# Patient Record
Sex: Male | Born: 1935 | Race: White | Hispanic: No | Smoking: Former smoker
Health system: Southern US, Community
[De-identification: ages and names within clinical notes are randomized; demographics above are authoritative.]

## PROBLEM LIST (undated history)

## (undated) DIAGNOSIS — I2510 Atherosclerotic heart disease of native coronary artery without angina pectoris: Secondary | ICD-10-CM

## (undated) DIAGNOSIS — R0602 Shortness of breath: Secondary | ICD-10-CM

## (undated) DIAGNOSIS — I10 Essential (primary) hypertension: Secondary | ICD-10-CM

## (undated) DIAGNOSIS — K4030 Unilateral inguinal hernia, with obstruction, without gangrene, not specified as recurrent: Secondary | ICD-10-CM

## (undated) DIAGNOSIS — R059 Cough, unspecified: Secondary | ICD-10-CM

## (undated) DIAGNOSIS — I452 Bifascicular block: Secondary | ICD-10-CM

## (undated) DIAGNOSIS — K259 Gastric ulcer, unspecified as acute or chronic, without hemorrhage or perforation: Secondary | ICD-10-CM

## (undated) DIAGNOSIS — K2990 Gastroduodenitis, unspecified, without bleeding: Secondary | ICD-10-CM

## (undated) DIAGNOSIS — F17200 Nicotine dependence, unspecified, uncomplicated: Secondary | ICD-10-CM

## (undated) DIAGNOSIS — R05 Cough: Secondary | ICD-10-CM

## (undated) DIAGNOSIS — D649 Anemia, unspecified: Secondary | ICD-10-CM

## (undated) DIAGNOSIS — I209 Angina pectoris, unspecified: Secondary | ICD-10-CM

## (undated) DIAGNOSIS — R609 Edema, unspecified: Secondary | ICD-10-CM

## (undated) DIAGNOSIS — Z720 Tobacco use: Secondary | ICD-10-CM

## (undated) DIAGNOSIS — J449 Chronic obstructive pulmonary disease, unspecified: Secondary | ICD-10-CM

## (undated) DIAGNOSIS — R011 Cardiac murmur, unspecified: Secondary | ICD-10-CM

## (undated) HISTORY — DX: Tobacco use: Z72.0

## (undated) HISTORY — PX: COLONOSCOPY: SHX174

## (undated) HISTORY — PX: CORONARY ANGIOPLASTY: SHX604

## (undated) HISTORY — DX: Essential (primary) hypertension: I10

## (undated) HISTORY — DX: Edema, unspecified: R60.9

## (undated) HISTORY — PX: CARDIAC CATHETERIZATION: SHX172

---

## 2009-07-22 ENCOUNTER — Emergency Department (HOSPITAL_COMMUNITY): Admission: EM | Admit: 2009-07-22 | Discharge: 2009-07-22 | Payer: Self-pay | Admitting: Emergency Medicine

## 2009-11-03 ENCOUNTER — Ambulatory Visit: Payer: Self-pay | Admitting: Cardiology

## 2009-11-03 DIAGNOSIS — R0602 Shortness of breath: Secondary | ICD-10-CM

## 2009-11-03 DIAGNOSIS — J4489 Other specified chronic obstructive pulmonary disease: Secondary | ICD-10-CM

## 2009-11-03 DIAGNOSIS — J449 Chronic obstructive pulmonary disease, unspecified: Secondary | ICD-10-CM

## 2009-11-03 DIAGNOSIS — I2510 Atherosclerotic heart disease of native coronary artery without angina pectoris: Secondary | ICD-10-CM | POA: Insufficient documentation

## 2009-11-03 DIAGNOSIS — F17200 Nicotine dependence, unspecified, uncomplicated: Secondary | ICD-10-CM | POA: Insufficient documentation

## 2009-11-03 HISTORY — DX: Atherosclerotic heart disease of native coronary artery without angina pectoris: I25.10

## 2009-11-03 HISTORY — DX: Nicotine dependence, unspecified, uncomplicated: F17.200

## 2009-11-03 HISTORY — DX: Other specified chronic obstructive pulmonary disease: J44.89

## 2009-11-03 HISTORY — DX: Chronic obstructive pulmonary disease, unspecified: J44.9

## 2009-11-04 ENCOUNTER — Encounter: Payer: Self-pay | Admitting: Cardiology

## 2009-11-11 ENCOUNTER — Ambulatory Visit: Payer: Self-pay | Admitting: Cardiology

## 2009-11-11 ENCOUNTER — Encounter (HOSPITAL_COMMUNITY): Admission: RE | Admit: 2009-11-11 | Discharge: 2009-11-11 | Payer: Self-pay | Admitting: Oncology

## 2009-11-20 ENCOUNTER — Encounter: Payer: Self-pay | Admitting: Cardiology

## 2009-11-21 ENCOUNTER — Encounter (INDEPENDENT_AMBULATORY_CARE_PROVIDER_SITE_OTHER): Payer: Self-pay

## 2010-02-11 ENCOUNTER — Encounter: Payer: Self-pay | Admitting: Cardiovascular Disease

## 2010-04-14 NOTE — Letter (Signed)
Summary: PROGRESS NOTES DR Sunday Spillers 2007  PROGRESS NOTES DR Sunday Spillers 2007   Imported By: Raechel Ache [facility] 11/04/2009 09:36:56  _____________________________________________________________________  External Attachment:    Type:   Image     Comment:   External Document

## 2010-04-14 NOTE — Miscellaneous (Signed)
Summary: Orders Update: Pulmonary referral  Clinical Lists Changes  Orders: Added new Referral order of Pulmonary Referral (Pulmonary) - Signed    Pt. advised of PFT results and Pulmonary referral. Pt. states he is going out of town for several weeks (leaving tomorrow) and would like to wait until returning home to scheduled appt. Pt. requested we schedule for the third week in October. Pt. advised that someone from this office will contact him when he returns home. He expressed verbal understanding.

## 2010-04-14 NOTE — Letter (Signed)
Summary: LABS 2008 DR Sunday Spillers  LABS 2008 DR BARBLER   Imported By: Raechel Ache [facility] 11/04/2009 09:38:01  _____________________________________________________________________  External Attachment:    Type:   Image     Comment:   External Document

## 2010-04-14 NOTE — Letter (Signed)
Summary: DR Margo Aye OFFICE NOTE  DR HALL OFFICE NOTE   Imported By: Faythe Ghee 02/11/2010 11:23:44  _____________________________________________________________________  External Attachment:    Type:   Image     Comment:   External Document

## 2010-04-14 NOTE — Letter (Signed)
Summary: [place] [facility] (Nuc Med Stress)  [facility]  [address], [place] [address]   Phone: [phone]  Fax: [phone]    Nuclear Medicine 1-Day Stress Test Information Sheet  Re:     Roberto Nelson   DOB:     10/13/1933 MRN:     [id number] Weight:  Appointment Date: Register at: Appointment Time: Referring MD:  ___Exercise Stress  __Adenosine   __Dobutamine  _X_Lexiscan  __Persantine   __Thallium  Urgency: ____1 (next day)   ____2 (one week)    ____3 (PRN)  Patient will receive Follow Up call with results: Patient needs follow-up appointment:  Instructions regarding medication:  How to prepare for your stress test: 1. DO NOT eat or dring 6 hours prior to your arrival time. This includes no caffeine (coffee, tea, sodas, chocolate) if you were instructed to take your medications, drink water with it. 2. DO NOT use any tobacco products for at leaset 8 hours prior to arrival. 3. DO NOT wear dresses or any clothing that may have metal clasps or buttons. 4. Wear short sleeve shirts, loose clothing, and comfortalbe walking shoes. 5. DO NOT use lotions, oils or powder on your chest before the test. 6. The test will take approximately 3-4 hours from the time you arrive until completion. 7. To register the day of the test, go to the Short Stay entrance at [facility]. 8. If you must cancel your test, call [phone] as soon as you are aware.  After you arrive for test:   When you arrive at [facility], you will go to Short Stay to be registered. They will then send you to Radiology to check in. The Nuclear Medicine Tech will get you and start an IV in your arm or hand. A small amount of a radioactive tracer will then be injected into your IV. This tracer will then have to circulate for 30-45 minutes. During this time you will wait in the waiting room and you will be able to drink something without caffeine. A series of pictures will be taken  of your heart follwoing this waiting period. After the 1st set of pictures you will go to the stress lab to get ready for your stress test. During the stress test, another small amount of a radioactive tracer will be injected through your IV. When the stress test is complete, there is a short rest period while your heart rate and blood pressure will be monitored. When this monitoring period is complete you will have another set of pictrues taken. (The same as the 1st set of pictures). These pictures are taken between 15 minutes and 1 hour after the stress test. The time depends on the type of stress test you had. Your doctor will inform you of your test results within 7 days after test.    The possibilities of certain changes are possible during the test. They include abnormal blood pressure and disorders of the heart. Side effects of persantine or adenosine can include flushing, chest pain, shortness of breath, stomach tightness, headache and light-headedness. These side effects usually do not last long and are self-resolving. Every effort will be made to keep you comfortable and to minimize complications by obtaining a medical history and by close observation during the test. Emergency equipment, medications, and trained personnel are available to deal with any unusual situation which may arise.  Please notify office at least 48 hours in advance if you are unable to keep  this appt.

## 2010-04-14 NOTE — Letter (Signed)
Summary: CT ABDOMEN 05-30-2006  CT ABDOMEN 05-30-2006   Imported By: Raechel Ache [facility] 11/04/2009 09:35:23  _____________________________________________________________________  External Attachment:    Type:   Image     Comment:   External Document

## 2010-04-14 NOTE — Assessment & Plan Note (Signed)
Summary: ***np6/new to area/Hernia Surgery/eval for stress test/   Visit Type:  Initial Consult Primary Roberto Nelson:  Roberto Nelson  CC:  no cardiology complaints.  History of Present Illness: Roberto Nelson is a gregarious pleasant 77 year old gentleman who comes in today for reoperative clearance for a large right inguinal hernia. He is referred by Dr. Abbey Chatters.  Unfortunately, we do not have his cardiac records. We do have a primary care physician's note from [place] dated May 25, 2006 and September 06, 2006.  He has a history of coronary disease and had a stent placed years ago. He does not know the stent type and is not caring identification card. He does remember that prior to his stent that he had significant tightness in his chest. We do not know if he infarcted.  He has significant dyspnea on exertion. He tries to walk about a mile a day on flat ground. He continues to smoke heavily. He has a chronic cough with secretions.  He denies orthopnea, PND or edema. He does not carry sublingual nitroglycerin.  His EKG from the outside shows normal sinus rhythm, left anterior fascicular block, and a right bundle branch block.  He seems to be compliant with his medications. He is followed here by Dr. Margo Aye.  Current Medications (verified): 1)  Plavix 75 Mg Tabs (Clopidogrel Bisulfate) .... Take 1 Tab Daily 2)  Lisinopril 10 Mg Tabs (Lisinopril) .... Take 1 Tab Daily 3)  Nitrostat 0.4 Mg Subl (Nitroglycerin) .... Use As Needed 4)  Toprol Xl 50 Mg Xr24h-Tab (Metoprolol Succinate) .... Take 1 Tab Daily  Allergies (verified): 1)  ! Penicillin  Review of Systems       negative other than history of present illness  Vital Signs:  Patient profile:   77 year old male Height:      67 inches Weight:      181 pounds BMI:     28.45 Pulse rate:   67 / minute BP sitting:   176 / 91  (right arm)  Vitals Entered By: Dreama Saa, CNA (November 03, 2009 3:13 PM)  Physical Exam  General:   no acute distress, looks older than stated age, coughing quite a bit the office. Head:  normocephalic and atraumatic Eyes:  PERRLA/EOM intact; conjunctiva and lids normal. Neck:  Neck supple, no JVD. No masses, thyromegaly or abnormal cervical nodes. Chest Wall:  no deformities or breast masses noted Lungs:  decreased breath sounds throughout with expiratory rhonchi that clears with coughing. Heart:  PMI poorly appreciated, soft S1-S2, no murmur rub or gallop. Regular rate and rhythm Abdomen:  normal except for a large right inguinal hernia. Msk:  Back normal, normal gait. Muscle strength and tone normal. Pulses:  pulses normal in all 4 extremities Extremities:  No clubbing or cyanosis. Neurologic:  Alert and oriented x 3. Skin:  Intact without lesions or rashes. Psych:  Normal affect.   EKG  Procedure date:  11/03/2009  Findings:      normal sinus rhythm, right bundle branch block, left anterior fascicular block, no acute changes.  Impression & Recommendations:  Problem # 1:  CORONARY ATHEROSCLEROSIS NATIVE CORONARY ARTERY (ICD-414.01) Unfortunately We have no records. His EKG is stable. His dyspnea is probably from significant COPD but could be an ischemic equivalent. He clearly needs a functional study prior to clearance. We'll arrange a Myoview. If there is any question about ejection fraction, we will obtain an echocardiogram. His updated medication list for this problem includes:    Plavix 75  Mg Tabs (Clopidogrel bisulfate) .Marland Kitchen... Take 1 tab daily    Lisinopril 10 Mg Tabs (Lisinopril) .Marland Kitchen... Take 1 tab daily    Nitrostat 0.4 Mg Subl (Nitroglycerin) ..... Use as needed    Toprol Xl 50 Mg Xr24h-tab (Metoprolol succinate) .Marland Kitchen... Take 1 tab daily  Orders: Nuclear Stress Test (Nuc Stress Test)  Problem # 2:  DYSPNEA (ICD-786.05)  His updated medication list for this problem includes:    Lisinopril 10 Mg Tabs (Lisinopril) .Marland Kitchen... Take 1 tab daily    Toprol Xl 50 Mg Xr24h-tab  (Metoprolol succinate) .Marland Kitchen... Take 1 tab daily  Orders: Nuclear Stress Test (Nuc Stress Test)  Problem # 3:  COPD (ICD-496) I have recommended pulmonary function studies with a room air blood gas. If these look marginal for surgery, I'll refer to pulmonary medicine Orders: Pulmonary Function Test (PFT)  Problem # 4:  TOBACCO ABUSE (ICD-305.1) Advised to quit.  Patient Instructions: 1)  Your physician recommends that you schedule a follow-up appointment in: TBA 2)  Your physician recommends that you continue on your current medications as directed. Please refer to the Current Medication list given to you today. 3)  Your physician has requested that you have an [employer]  For further information please visit [email or web address]  Please follow instruction sheet, as given. 4)  Your physician has recommended that you have a pulmonary function test.  Pulmonary Function Tests are a group of tests that measure how well air moves in and out of your lungs.

## 2010-05-29 LAB — BLOOD GAS, ARTERIAL
Bicarbonate: 28.1 mEq/L — ABNORMAL HIGH (ref 20.0–24.0)
FIO2: 0.21 %
pH, Arterial: 7.414 (ref 7.350–7.450)
pO2, Arterial: 69.9 mmHg — ABNORMAL LOW (ref 80.0–100.0)

## 2010-06-02 LAB — URINALYSIS, ROUTINE W REFLEX MICROSCOPIC
Glucose, UA: NEGATIVE mg/dL
Nitrite: NEGATIVE
Protein, ur: NEGATIVE mg/dL
Specific Gravity, Urine: 1.015 (ref 1.005–1.030)
pH: 6.5 (ref 5.0–8.0)

## 2011-03-15 ENCOUNTER — Encounter: Payer: Self-pay | Admitting: Cardiology

## 2011-11-01 ENCOUNTER — Encounter (INDEPENDENT_AMBULATORY_CARE_PROVIDER_SITE_OTHER): Payer: Self-pay | Admitting: General Surgery

## 2011-11-01 ENCOUNTER — Ambulatory Visit (INDEPENDENT_AMBULATORY_CARE_PROVIDER_SITE_OTHER): Payer: Medicare Other | Admitting: General Surgery

## 2011-11-01 ENCOUNTER — Encounter (INDEPENDENT_AMBULATORY_CARE_PROVIDER_SITE_OTHER): Payer: Self-pay

## 2011-11-01 VITALS — BP 180/98 | HR 80 | Temp 98.3°F | Ht 67.0 in | Wt 169.0 lb

## 2011-11-01 DIAGNOSIS — K4090 Unilateral inguinal hernia, without obstruction or gangrene, not specified as recurrent: Secondary | ICD-10-CM | POA: Insufficient documentation

## 2011-11-01 NOTE — Patient Instructions (Signed)
We will arrange for you to see a Cardiologist to make sure it is safe to do your surgery from a heart standpoint.

## 2011-11-01 NOTE — Progress Notes (Signed)
Patient ID: Roberto Nelson, male   DOB: [birth year], 78 y.o.   MRN: [id number]  Chief Complaint  Patient presents with  . Pre-op Exam    eval RIH    HPI Roberto Nelson is a 78 y.o. male.   HPI  He is well known to me. I saw him 2 years ago and we discussed repair of a large right inguinal hernia. He had multiple things occurring in his life at that time and so did not schedule the surgery. He is sent over by Dr. Margo Aye to discuss hernia repair again.  No obstructive symptoms.  Past Medical History  Diagnosis Date  . Hypertension   . Edema   . Atherosclerosis   . Tobacco user     Past Surgical History  Procedure Date  . Colonoscopy   . Hernia repair   . Cardiac catheterization     History reviewed. No pertinent family history.  Social History History  Substance Use Topics  . Smoking status: Current Everyday Smoker -- 0.5 packs/day    Types: Cigarettes  . Smokeless tobacco: Not on file  . Alcohol Use: No    Allergies  Allergen Reactions  . Penicillins     Current Outpatient Prescriptions  Medication Sig Dispense Refill  . clopidogrel (PLAVIX) 75 MG tablet Take 75 mg by mouth daily.        Marland Kitchen lisinopril (PRINIVIL,ZESTRIL) 10 MG tablet Take 10 mg by mouth daily.        . metoprolol (TOPROL-XL) 50 MG 24 hr tablet Take 50 mg by mouth daily.        . nitroGLYCERIN (NITROSTAT) 0.4 MG SL tablet Place 0.4 mg under the tongue as needed.          Review of Systems Review of Systems  Respiratory: Negative.   Cardiovascular: Negative.   Gastrointestinal: Negative for nausea, vomiting, abdominal pain and constipation.  Genitourinary: Negative for difficulty urinating.    Blood pressure 180/98, pulse 80, temperature 98.3 F (36.8 C), temperature source Temporal, height 5\' 7"  (1.702 m), weight 169 lb (76.658 kg), SpO2 93.00%.  Physical Exam Physical Exam  Constitutional: He appears well-developed and well-nourished. No distress.  Cardiovascular: Normal rate.    Intermittently irregular rhythm.  Pulmonary/Chest: Effort normal and breath sounds normal.  Abdominal: Soft. He exhibits no mass. There is no tenderness.  Genitourinary:       Large right inguinal hernia extending into scrotum that is not reducible.  Cannot palpate right testicle.  Musculoskeletal: Normal range of motion. He exhibits no edema.    Data Reviewed Old note.  Assessment    Large chronically incarcerated right inguinal hernia extending into scrotum.  Needs cardiac clearance prior to surgery.    Plan    Consult to Cardiology and if risk is not prohibitive will proceed with open repair of hernia with mesh and an overnight stay.  Will need to be off Plavix for seven days.  I have explained the procedure, risks, and aftercare of inguinal hernia repair.  Risks include but are not limited to bleeding, infection, wound problems, anesthesia, recurrence, bladder or intestine injury, urinary retention, testicular dysfunction, chronic pain, mesh problems.  He seems to understand and agrees with the plan.       Temple Ewart J 11/01/2011, 10:52 AM

## 2011-11-24 ENCOUNTER — Encounter (INDEPENDENT_AMBULATORY_CARE_PROVIDER_SITE_OTHER): Payer: Self-pay

## 2011-11-26 ENCOUNTER — Encounter (INDEPENDENT_AMBULATORY_CARE_PROVIDER_SITE_OTHER): Payer: Self-pay

## 2011-12-01 ENCOUNTER — Telehealth (INDEPENDENT_AMBULATORY_CARE_PROVIDER_SITE_OTHER): Payer: Self-pay

## 2011-12-01 NOTE — Telephone Encounter (Signed)
Pt notified of appt with Dr. Kathrynn Speed on 12/30/11 at 10:00am in the [place] office.

## 2011-12-09 ENCOUNTER — Telehealth (INDEPENDENT_AMBULATORY_CARE_PROVIDER_SITE_OTHER): Payer: Self-pay

## 2011-12-09 NOTE — Telephone Encounter (Signed)
Dr Dwana Melena called stating pt will need our office to refer pt to cardiologist for clearance and ok to d/c plavix 5 days pre op.

## 2011-12-30 ENCOUNTER — Encounter: Payer: Self-pay | Admitting: Cardiology

## 2011-12-30 ENCOUNTER — Ambulatory Visit (INDEPENDENT_AMBULATORY_CARE_PROVIDER_SITE_OTHER): Payer: Self-pay | Admitting: Cardiology

## 2011-12-30 VITALS — BP 190/100 | HR 56 | Ht 67.0 in | Wt 174.4 lb

## 2011-12-30 DIAGNOSIS — R0602 Shortness of breath: Secondary | ICD-10-CM

## 2011-12-30 DIAGNOSIS — J4489 Other specified chronic obstructive pulmonary disease: Secondary | ICD-10-CM

## 2011-12-30 DIAGNOSIS — R600 Localized edema: Secondary | ICD-10-CM | POA: Insufficient documentation

## 2011-12-30 DIAGNOSIS — I2510 Atherosclerotic heart disease of native coronary artery without angina pectoris: Secondary | ICD-10-CM

## 2011-12-30 DIAGNOSIS — J449 Chronic obstructive pulmonary disease, unspecified: Secondary | ICD-10-CM

## 2011-12-30 DIAGNOSIS — F17200 Nicotine dependence, unspecified, uncomplicated: Secondary | ICD-10-CM

## 2011-12-30 DIAGNOSIS — R609 Edema, unspecified: Secondary | ICD-10-CM

## 2011-12-30 DIAGNOSIS — I452 Bifascicular block: Secondary | ICD-10-CM

## 2011-12-30 HISTORY — DX: Bifascicular block: I45.2

## 2011-12-30 LAB — BASIC METABOLIC PANEL
Calcium: 9.4 mg/dL (ref 8.4–10.5)
Glucose, Bld: 81 mg/dL (ref 70–99)

## 2011-12-30 MED ORDER — LISINOPRIL 40 MG PO TABS: 40.0000 mg | ORAL_TABLET | Freq: Every day | ORAL | Status: DC

## 2011-12-30 MED ORDER — NITROGLYCERIN 0.4 MG SL SUBL: 0.4000 mg | SUBLINGUAL_TABLET | SUBLINGUAL | Status: DC | PRN

## 2011-12-30 MED ORDER — FUROSEMIDE 40 MG PO TABS: 40.0000 mg | ORAL_TABLET | Freq: Every day | ORAL | Status: DC

## 2011-12-30 NOTE — Progress Notes (Signed)
HPI [name] is a pleasant 78 year old white male referred by Dr. Abbey Chatters for surgical clearance for a large right inguinal hernia.  He has a history of coronary artery disease and has had a previous stent placed in [place] and 2005. He remains on Plavix after all these years. He is not taking aspirin.  He of has occasional chest tightness with ambulation. Mostly as dyspnea on exertion which is been stable. He denies orthopnea or PND. He continues to smoke about 5 cigarettes a day. He has had significant swelling in his legs.  His nitroglycerin has expired. He is compliant with his medications otherwise. He is followed by Dr. Margo Aye of primary care.  He denies any bleeding but has had a lot of bruising of his upper extremities. He would like to come off of Plavix.  He has a history of hypertension. He does not check his on regular basis.  Past Medical History  Diagnosis Date  . Hypertension   . Edema   . Atherosclerosis   . Tobacco user     Current Outpatient Prescriptions  Medication Sig Dispense Refill  . clopidogrel (PLAVIX) 75 MG tablet Take 75 mg by mouth daily.        Marland Kitchen lisinopril (PRINIVIL,ZESTRIL) 20 MG tablet Take 20 mg by mouth daily.      . metoprolol (TOPROL-XL) 50 MG 24 hr tablet Take 50 mg by mouth daily.        . nitroGLYCERIN (NITROSTAT) 0.4 MG SL tablet Place 0.4 mg under the tongue as needed.          Allergies  Allergen Reactions  . Penicillins     No family history on file.  History   Social History  . Marital Status: Widowed    Spouse Name: N/A    Number of Children: 1  . Years of Education: N/A   Occupational History  . Retired    Social History Main Topics  . Smoking status: Current Every Day Smoker -- 0.5 packs/day    Types: Cigarettes  . Smokeless tobacco: Not on file  . Alcohol Use: No  . Drug Use: No  . Sexually Active: Not on file   Other Topics Concern  . Not on file   Social History Narrative   Exercises regularly1  daughter    ROS ALL NEGATIVE EXCEPT THOSE NOTED IN HPI  PE  General Appearance: well developed, well nourished in no acute distress HEENT: symmetrical face, PERRLA, good dentition  Neck: no JVD, thyromegaly, or adenopathy, trachea midline Chest: symmetric without deformity Cardiac: PMI non-displaced, RRR, soft S1-S2, no gallop or murmur Lung: clear to ausculation and percussion Vascular: all pulses full without bruits  Abdominal: nondistended, nontender, good bowel sounds, no HSM, no bruits Extremities: no cyanosis, clubbing , 3+ pedal edema bilaterally no sign of DVT, no varicosities  Skin: normal color, no rashes Neuro: alert and oriented x 3, non-focal Pysch: normal affect  EKG Sinus bradycardia, left anterior fascicular block, right bundle branch block. No old tracings to compare. BMET No results found for this basename: na, k, cl, co2, glucose, bun, creatinine, calcium, gfrnonaa, gfraa    Lipid Panel  No results found for this basename: chol, trig, hdl, cholhdl, vldl, ldlcalc    CBC No results found for this basename: wbc, rbc, hgb, hct, plt, mcv, mch, mchc, rdw, neutrabs, lymphsabs, monoabs, eosabs, basosabs

## 2011-12-30 NOTE — Assessment & Plan Note (Signed)
He has significant lower extremity edema. This could be from left ventricular or right ventricular dysfunction. He probably has significant COPD and continues to smoke. Since I do not have any records and this is a significant problem, not to mention that his blood pressure is extremely high, we'll not clear for surgery until further evaluation. This will include a 2-D echocardiogram, increasing his lisinopril to 40 mg a day, adding Lasix 40 mg every morning, salt restriction, stop smoking, potassium rich diet, with followup in the office next week for blood pressure check and metabolic profile. We'll also check a baseline metabolic profile today.  If his blood pressures under good control, and his echocardiogram shows relatively good LV function, will clear for surgery. I discontinued his Plavix and changed him to aspirin 81 mg a day. We have refilled his subungual nitroglycerin.

## 2011-12-30 NOTE — Patient Instructions (Addendum)
Your physician recommends that you schedule a follow-up appointment in: 1 week follow up for Blood pressure check and Lab work  Your physician recommends that you return for lab work in: Today and next Thursday  Your physician has recommended you make the following change in your medication:  1 - START Aspirin 81 mg daily 2 - STOP Plavix 3 - INCREASE Lisinopril to 40 mg daily 4 - START Lasix 40 mg daily  Your physician has requested that you have an echocardiogram. Echocardiography is a painless test that uses sound waves to create images of your heart. It provides your doctor with information about the size and shape of your heart and how well your heart's chambers and valves are working. This procedure takes approximately one hour. There are no restrictions for this procedure.  Your physician recommends that you weigh, daily, at the same time every day, and in the same amount of clothing. Please record your daily weights on the handout provided and bring it to your next appointment.  Potassium rich diet - See attached information  STOP Smoking - See attached information   Low Sodium Diet - See attached information

## 2011-12-31 ENCOUNTER — Other Ambulatory Visit: Payer: Self-pay | Admitting: *Deleted

## 2011-12-31 DIAGNOSIS — R600 Localized edema: Secondary | ICD-10-CM

## 2011-12-31 DIAGNOSIS — J4489 Other specified chronic obstructive pulmonary disease: Secondary | ICD-10-CM

## 2011-12-31 DIAGNOSIS — I2510 Atherosclerotic heart disease of native coronary artery without angina pectoris: Secondary | ICD-10-CM

## 2011-12-31 DIAGNOSIS — J449 Chronic obstructive pulmonary disease, unspecified: Secondary | ICD-10-CM

## 2011-12-31 DIAGNOSIS — F17200 Nicotine dependence, unspecified, uncomplicated: Secondary | ICD-10-CM

## 2011-12-31 DIAGNOSIS — R0602 Shortness of breath: Secondary | ICD-10-CM

## 2012-01-07 ENCOUNTER — Ambulatory Visit (INDEPENDENT_AMBULATORY_CARE_PROVIDER_SITE_OTHER): Payer: Medicare Other | Admitting: *Deleted

## 2012-01-07 ENCOUNTER — Ambulatory Visit (HOSPITAL_COMMUNITY)
Admission: RE | Admit: 2012-01-07 | Discharge: 2012-01-07 | Disposition: A | Discharge status: Home / Self Care | Payer: Medicare Other | Source: Ambulatory Visit | Attending: Cardiology | Admitting: Cardiology

## 2012-01-07 VITALS — BP 150/61 | HR 64 | Ht 67.0 in | Wt 172.0 lb

## 2012-01-07 DIAGNOSIS — I10 Essential (primary) hypertension: Secondary | ICD-10-CM

## 2012-01-07 DIAGNOSIS — I369 Nonrheumatic tricuspid valve disorder, unspecified: Secondary | ICD-10-CM

## 2012-01-07 DIAGNOSIS — J449 Chronic obstructive pulmonary disease, unspecified: Secondary | ICD-10-CM

## 2012-01-07 DIAGNOSIS — K4090 Unilateral inguinal hernia, without obstruction or gangrene, not specified as recurrent: Secondary | ICD-10-CM | POA: Insufficient documentation

## 2012-01-07 DIAGNOSIS — R600 Localized edema: Secondary | ICD-10-CM

## 2012-01-07 DIAGNOSIS — R0989 Other specified symptoms and signs involving the circulatory and respiratory systems: Secondary | ICD-10-CM | POA: Insufficient documentation

## 2012-01-07 DIAGNOSIS — I2510 Atherosclerotic heart disease of native coronary artery without angina pectoris: Secondary | ICD-10-CM

## 2012-01-07 DIAGNOSIS — I079 Rheumatic tricuspid valve disease, unspecified: Secondary | ICD-10-CM | POA: Insufficient documentation

## 2012-01-07 DIAGNOSIS — I4510 Unspecified right bundle-branch block: Secondary | ICD-10-CM | POA: Insufficient documentation

## 2012-01-07 DIAGNOSIS — R0602 Shortness of breath: Secondary | ICD-10-CM

## 2012-01-07 DIAGNOSIS — J4489 Other specified chronic obstructive pulmonary disease: Secondary | ICD-10-CM

## 2012-01-07 DIAGNOSIS — F17200 Nicotine dependence, unspecified, uncomplicated: Secondary | ICD-10-CM

## 2012-01-07 DIAGNOSIS — R0609 Other forms of dyspnea: Secondary | ICD-10-CM | POA: Insufficient documentation

## 2012-01-07 NOTE — Progress Notes (Signed)
*  PRELIMINARY RESULTS* Echocardiogram 2D Echocardiogram has been performed.  Roberto Nelson 01/07/2012, 9:09 AM

## 2012-01-07 NOTE — Progress Notes (Signed)
Patient presents for blood pressure check.  States he has been taking his blood pressures before taking his medications, therefore has been getting unsatisfactory readings, see scanned BP list.  Advised him to continue taking pressures, however to take at least 1 hour after mediations have been taken.  BP was 190/100 at OV and changes to medications were made, as follows:  INCREASE Lisinopril to 40 mg daily,  START Lasix 40 mg daily.

## 2012-01-19 ENCOUNTER — Ambulatory Visit (INDEPENDENT_AMBULATORY_CARE_PROVIDER_SITE_OTHER): Payer: Medicare Other | Admitting: *Deleted

## 2012-01-19 ENCOUNTER — Encounter: Payer: Self-pay | Admitting: *Deleted

## 2012-01-19 VITALS — BP 144/80 | HR 60 | Wt 166.0 lb

## 2012-01-19 DIAGNOSIS — I10 Essential (primary) hypertension: Secondary | ICD-10-CM

## 2012-01-20 ENCOUNTER — Telehealth: Payer: Self-pay | Admitting: *Deleted

## 2012-01-20 ENCOUNTER — Encounter: Payer: Self-pay | Admitting: *Deleted

## 2012-01-20 NOTE — Telephone Encounter (Signed)
I spoke with pt after Dr. Daleen Squibb reviewed recent blood pressures.  He has cleared hiim for his hernia surgery. I will also e-send clearance letter to Dr. Abbey Chatters. Mylo Red RN

## 2012-01-21 ENCOUNTER — Other Ambulatory Visit (INDEPENDENT_AMBULATORY_CARE_PROVIDER_SITE_OTHER): Payer: Self-pay | Admitting: General Surgery

## 2012-01-21 NOTE — Progress Notes (Signed)
Patient presented for blood pressure check.  Medications reviewed.  No complaints noted at this time.

## 2012-01-31 ENCOUNTER — Encounter (HOSPITAL_COMMUNITY): Payer: Self-pay | Admitting: Pharmacy Technician

## 2012-02-03 ENCOUNTER — Encounter (HOSPITAL_COMMUNITY)
Admission: RE | Admit: 2012-02-03 | Discharge: 2012-02-03 | Disposition: A | Discharge status: Home / Self Care | Payer: Medicare Other | Source: Ambulatory Visit | Attending: General Surgery | Admitting: General Surgery

## 2012-02-03 ENCOUNTER — Encounter (HOSPITAL_COMMUNITY)
Admission: RE | Admit: 2012-02-03 | Discharge: 2012-02-03 | Disposition: A | Discharge status: Home / Self Care | Payer: Medicare Other | Source: Ambulatory Visit | Attending: Anesthesiology | Admitting: Anesthesiology

## 2012-02-03 ENCOUNTER — Encounter (HOSPITAL_COMMUNITY): Payer: Self-pay

## 2012-02-03 HISTORY — DX: Shortness of breath: R06.02

## 2012-02-03 HISTORY — DX: Cough: R05

## 2012-02-03 HISTORY — DX: Cough, unspecified: R05.9

## 2012-02-03 HISTORY — DX: Cardiac murmur, unspecified: R01.1

## 2012-02-03 HISTORY — DX: Angina pectoris, unspecified: I20.9

## 2012-02-03 LAB — COMPREHENSIVE METABOLIC PANEL
ALT: 9 U/L (ref 0–53)
AST: 21 U/L (ref 0–37)
CO2: 32 mEq/L (ref 19–32)
Calcium: 9.3 mg/dL (ref 8.4–10.5)
Chloride: 98 mEq/L (ref 96–112)
GFR calc non Af Amer: 80 mL/min — ABNORMAL LOW (ref >90)
Potassium: 3.8 mEq/L (ref 3.5–5.1)
Sodium: 139 mEq/L (ref 135–145)
Total Bilirubin: 0.8 mg/dL (ref 0.3–1.2)

## 2012-02-03 LAB — CBC WITH DIFFERENTIAL/PLATELET
Basophils Absolute: 0 10*3/uL (ref 0.0–0.1)
Lymphocytes Relative: 25 % (ref 12–46)
Neutro Abs: 6.4 10*3/uL (ref 1.7–7.7)
Platelets: 163 10*3/uL (ref 150–400)
RDW: 13.1 % (ref 11.5–15.5)
WBC: 9.8 10*3/uL (ref 4.0–10.5)

## 2012-02-03 LAB — SURGICAL PCR SCREEN: MRSA, PCR: NEGATIVE

## 2012-02-03 NOTE — Pre-Procedure Instructions (Addendum)
20 Roberto Nelson  02/03/2012   Your procedure is scheduled on: Monday  02/14/12     Report to [facility] at 1030 AM.  Call this number if you have problems the morning of surgery: [phone]   Remember:   Do not eat food OR DRINK :After Midnight.   Take these medicines the morning of surgery with A SIP OF WATER:  METOPROLOL (TOPROL) STOP ASPIRIN, COUMADIN, PLAVIX, EFFIENT, HERBAL MEDICINES   Do not wear jewelry, make-up or nail polish.  Do not wear lotions, powders, or perfumes. You may wear deodorant.  Do not shave 48 hours prior to surgery. Men may shave face and neck.  Do not bring valuables to the hospital.  Contacts, dentures or bridgework may not be worn into surgery.  Leave suitcase in the car. After surgery it may be brought to your room.  For patients admitted to the hospital, checkout time is 11:00 AM the day of discharge.   Patients discharged the day of surgery will not be allowed to drive home.  Name and phone number of your driver:   Special Instructions: Shower using CHG 2 nights before surgery and the night before surgery.  If you shower the day of surgery use CHG.  Use special wash - you have one bottle of CHG for all showers.  You should use approximately 1/3 of the bottle for each shower.   Please read over the following fact sheets that you were given: Pain Booklet, Coughing and Deep Breathing, MRSA Information and Surgical Site Infection Prevention

## 2012-02-03 NOTE — Progress Notes (Signed)
02/03/12 1153  OBSTRUCTIVE SLEEP APNEA  Have you ever been diagnosed with sleep apnea through a sleep study? No  Do you snore loudly (loud enough to be heard through closed doors)?  0  Do you often feel tired, fatigued, or sleepy during the daytime? 1  Has anyone observed you stop breathing during your sleep? 0  Do you have, or are you being treated for high blood pressure? 1  BMI more than 35 kg/m2? 0  Age over 76 years old? 1  Neck circumference greater than 40 cm/18 inches? 0  Gender: 1  Obstructive Sleep Apnea Score 4   Score 4 or greater  Results sent to PCP

## 2012-02-07 NOTE — Consult Note (Signed)
Anesthesia chart review: Patient is a 78 year old male scheduled for right inguinal hernia repair by Dr. Abbey Chatters on 02/14/2012. History includes smoking, hypertension, CAD status post previous stent in 2005 [facility]).  His OSA screening score was 4.   PCP is Dr. Catalina Pizza.  Patient was seen by Cardiologist Dr. Daleen Squibb for cardiac clearance on 12/30/2011. EKG then showed sinus bradycardia, right bundle branch block, left anterior fascicular block, bifascicular block. He ordered an echo which was done on 01/07/12 and showed: - Left ventricle: The cavity size was normal. There was moderate focal basal hypertrophy. Systolic function was normal. The estimated ejection fraction was in the range of 55% to 60%. Wall motion was normal; there were no regional wall motion abnormalities. Doppler parameters are consistent with abnormal left ventricular relaxation (grade 1 diastolic dysfunction). - Mitral valve: Calcified annulus. Trivial regurgitation. - Left atrium: The atrium was at the upper limits of normal in size. - Tricuspid valve: Mild regurgitation. - Pulmonary arteries: Systolic pressure could not be accurately estimated. - Pericardium, extracardiac: There was no pericardial effusion. Dr. Daleen Squibb had the patient return for hypertension re-evaluations and increased his lisinopril and added Lasix, but ultimately cleared patient for this procedure.  Nuclear stress test on 11/11/09 showed: Abnormal combined exercise and pharmacologic stress nuclear myocardial study revealing mild left ventricular dilatation and mild to moderate left ventricular dysfunction in a segmental pattern, no electrocardiographic findings to suggest exercise induced ischemia, and inferolateral and apical infarction without significant ischemia. EF 37%.   CXR on 02/03/12 showed: 1. Lungs are very hyperaerated consistent with severe COPD.  2. No definite active process.  3. Probable nipple shadows at the lung bases. Consider repeat  film with nipple markers.  PFT report on 12/03/09 showed: 1. Spirometry shows a severe ventilatory defect with evidence of airflow obstruction. 2. Lung findings are normal. 3. DLCO is normal. 4. ABG is normal with relative resting hypoxemia, but no need for supplemental O2. 5. There was no significant bronchodilator improvement. 6. Noting the patient's smoking history, this is consistent with a diagnosis of COPD.  Labs noted.  Cr 0.92.    Patient's BP at PAT was 190/76.  (Dr. Daleen Squibb wanted his BP to be ~ 150/90 for surgery.)  Patient reports that it was taken after he had walked a significant distance from the parking lot.  Unfortunately, it was not rechecked.  He has a home BP cuff and said his BP approximately three days ago was ~ 160's/70's.  I have advised him to take his lisinopril as well on the morning of surgery.  If regards to his COPD, patient feels his respiratory status is at baseline.  He does continue to smoke, but is down to ~ 1 pack every 3-4 days.  He is able to walk ~ one mile several times a week at a regular pace without significant SOB, but does admit to occasional SOB at rest (non currently).  I reviewed CAD, HTN, and COPD history with Anesthesiologist Dr. Gypsy Balsam.  He agrees with patient taking both his ACEI and B-blocker on the morning of surgery.  (I sent a staff message via Epic to Dr. Abbey Chatters regarding patient's BP and CXR findings.  I'll defer additional orders to Dr. Abbey Chatters.)  Shonna Chock, PA-C 02/07/12 1537

## 2012-02-13 MED ORDER — VANCOMYCIN HCL IN DEXTROSE 1-5 GM/200ML-% IV SOLN: 1000.0000 mg | INTRAVENOUS | Status: DC

## 2012-02-14 ENCOUNTER — Ambulatory Visit (HOSPITAL_COMMUNITY): Payer: Medicare Other | Admitting: Vascular Surgery

## 2012-02-14 ENCOUNTER — Ambulatory Visit (HOSPITAL_COMMUNITY)
Admission: RE | Admit: 2012-02-14 | Discharge: 2012-02-15 | Disposition: A | Discharge status: Home / Self Care | Payer: Medicare Other | Source: Ambulatory Visit | Attending: General Surgery | Admitting: General Surgery

## 2012-02-14 ENCOUNTER — Encounter (HOSPITAL_COMMUNITY)
Admission: RE | Disposition: A | Discharge status: Home / Self Care | Payer: Self-pay | Source: Ambulatory Visit | Attending: General Surgery

## 2012-02-14 ENCOUNTER — Encounter (HOSPITAL_COMMUNITY): Payer: Self-pay | Admitting: Vascular Surgery

## 2012-02-14 ENCOUNTER — Encounter (HOSPITAL_COMMUNITY): Payer: Self-pay | Admitting: General Practice

## 2012-02-14 DIAGNOSIS — Z01812 Encounter for preprocedural laboratory examination: Secondary | ICD-10-CM | POA: Insufficient documentation

## 2012-02-14 DIAGNOSIS — F17200 Nicotine dependence, unspecified, uncomplicated: Secondary | ICD-10-CM | POA: Insufficient documentation

## 2012-02-14 DIAGNOSIS — J4489 Other specified chronic obstructive pulmonary disease: Secondary | ICD-10-CM | POA: Insufficient documentation

## 2012-02-14 DIAGNOSIS — K4030 Unilateral inguinal hernia, with obstruction, without gangrene, not specified as recurrent: Secondary | ICD-10-CM

## 2012-02-14 DIAGNOSIS — J449 Chronic obstructive pulmonary disease, unspecified: Secondary | ICD-10-CM | POA: Insufficient documentation

## 2012-02-14 DIAGNOSIS — Z01818 Encounter for other preprocedural examination: Secondary | ICD-10-CM | POA: Insufficient documentation

## 2012-02-14 DIAGNOSIS — I10 Essential (primary) hypertension: Secondary | ICD-10-CM | POA: Insufficient documentation

## 2012-02-14 HISTORY — DX: Unilateral inguinal hernia, with obstruction, without gangrene, not specified as recurrent: K40.30

## 2012-02-14 HISTORY — PX: INSERTION OF MESH: SHX5868

## 2012-02-14 HISTORY — PX: INGUINAL HERNIA REPAIR: SHX194

## 2012-02-14 SURGERY — REPAIR, HERNIA, INGUINAL, ADULT
Anesthesia: General | Site: Groin | Laterality: Right | Wound class: Clean

## 2012-02-14 MED ORDER — NEOSTIGMINE METHYLSULFATE 1 MG/ML IJ SOLN
INTRAMUSCULAR | Status: DC | PRN
  Administered 2012-02-14: 3 mg via INTRAVENOUS

## 2012-02-14 MED ORDER — LACTATED RINGERS IV SOLN
INTRAVENOUS | Status: DC | PRN
  Administered 2012-02-14 (×2): via INTRAVENOUS

## 2012-02-14 MED ORDER — 0.9 % SODIUM CHLORIDE (POUR BTL) OPTIME
TOPICAL | Status: DC | PRN
  Administered 2012-02-14: 1000 mL

## 2012-02-14 MED ORDER — OXYCODONE HCL 5 MG PO TABS: 5.0000 mg | ORAL_TABLET | Freq: Once | ORAL | Status: DC | PRN

## 2012-02-14 MED ORDER — DOCUSATE SODIUM 100 MG PO CAPS
100.0000 mg | ORAL_CAPSULE | Freq: Two times a day (BID) | ORAL | Status: DC
  Administered 2012-02-14 – 2012-02-15 (×2): 100 mg via ORAL
  Filled 2012-02-14 (×2): qty 1

## 2012-02-14 MED ORDER — HYDROMORPHONE HCL PF 1 MG/ML IJ SOLN
INTRAMUSCULAR | Status: AC
  Filled 2012-02-14: qty 1

## 2012-02-14 MED ORDER — LIDOCAINE HCL (CARDIAC) 20 MG/ML IV SOLN
INTRAVENOUS | Status: DC | PRN
  Administered 2012-02-14: 30 mg via INTRAVENOUS

## 2012-02-14 MED ORDER — ROCURONIUM BROMIDE 100 MG/10ML IV SOLN
INTRAVENOUS | Status: DC | PRN
  Administered 2012-02-14: 40 mg via INTRAVENOUS

## 2012-02-14 MED ORDER — OXYCODONE HCL 5 MG/5ML PO SOLN: 5.0000 mg | Freq: Once | ORAL | Status: DC | PRN

## 2012-02-14 MED ORDER — GLYCOPYRROLATE 0.2 MG/ML IJ SOLN
INTRAMUSCULAR | Status: DC | PRN
  Administered 2012-02-14: .6 mg via INTRAVENOUS

## 2012-02-14 MED ORDER — LACTATED RINGERS IV SOLN
INTRAVENOUS | Status: DC
  Administered 2012-02-14: 11:00:00 via INTRAVENOUS

## 2012-02-14 MED ORDER — VANCOMYCIN HCL IN DEXTROSE 1-5 GM/200ML-% IV SOLN
INTRAVENOUS | Status: AC
  Administered 2012-02-14: 1000 mg via INTRAVENOUS
  Filled 2012-02-14: qty 200

## 2012-02-14 MED ORDER — BUPIVACAINE-EPINEPHRINE (PF) 0.5% -1:200000 IJ SOLN
INTRAMUSCULAR | Status: AC
  Filled 2012-02-14: qty 10

## 2012-02-14 MED ORDER — MORPHINE SULFATE 2 MG/ML IJ SOLN
2.0000 mg | INTRAMUSCULAR | Status: DC | PRN
Start: 2012-02-14 — End: 2012-02-15
  Administered 2012-02-14 – 2012-02-15 (×2): 2 mg via INTRAVENOUS
  Filled 2012-02-14 (×2): qty 1

## 2012-02-14 MED ORDER — EPHEDRINE SULFATE 50 MG/ML IJ SOLN
INTRAMUSCULAR | Status: DC | PRN
  Administered 2012-02-14: 10 mg via INTRAVENOUS

## 2012-02-14 MED ORDER — BUPIVACAINE-EPINEPHRINE 0.5% -1:200000 IJ SOLN
INTRAMUSCULAR | Status: DC | PRN
  Administered 2012-02-14: 14 mL

## 2012-02-14 MED ORDER — FUROSEMIDE 40 MG PO TABS
40.0000 mg | ORAL_TABLET | Freq: Every day | ORAL | Status: DC
  Administered 2012-02-15: 40 mg via ORAL
  Filled 2012-02-14 (×2): qty 1

## 2012-02-14 MED ORDER — LISINOPRIL 40 MG PO TABS
40.0000 mg | ORAL_TABLET | Freq: Every day | ORAL | Status: DC
Start: 2012-02-15 — End: 2012-02-15
  Administered 2012-02-15: 40 mg via ORAL
  Filled 2012-02-14: qty 1

## 2012-02-14 MED ORDER — METOPROLOL SUCCINATE ER 50 MG PO TB24
50.0000 mg | ORAL_TABLET | Freq: Every day | ORAL | Status: DC
  Administered 2012-02-15: 50 mg via ORAL
  Filled 2012-02-14: qty 1

## 2012-02-14 MED ORDER — ONDANSETRON HCL 4 MG/2ML IJ SOLN
INTRAMUSCULAR | Status: DC | PRN
  Administered 2012-02-14: 4 mg via INTRAVENOUS

## 2012-02-14 MED ORDER — FENTANYL CITRATE 0.05 MG/ML IJ SOLN
INTRAMUSCULAR | Status: DC | PRN
  Administered 2012-02-14: 100 ug via INTRAVENOUS
  Administered 2012-02-14: 50 ug via INTRAVENOUS

## 2012-02-14 MED ORDER — OXYCODONE-ACETAMINOPHEN 5-325 MG PO TABS
1.0000 | ORAL_TABLET | ORAL | Status: DC | PRN
  Administered 2012-02-15: 2 via ORAL
  Filled 2012-02-14: qty 2

## 2012-02-14 MED ORDER — ONDANSETRON HCL 4 MG/2ML IJ SOLN: 4.0000 mg | Freq: Four times a day (QID) | INTRAMUSCULAR | Status: DC | PRN

## 2012-02-14 MED ORDER — HYDROMORPHONE HCL PF 1 MG/ML IJ SOLN
0.2500 mg | INTRAMUSCULAR | Status: DC | PRN
  Administered 2012-02-14 (×2): 0.5 mg via INTRAVENOUS

## 2012-02-14 MED ORDER — PROPOFOL 10 MG/ML IV BOLUS
INTRAVENOUS | Status: DC | PRN
  Administered 2012-02-14: 30 mg via INTRAVENOUS

## 2012-02-14 MED ORDER — NITROGLYCERIN 0.4 MG SL SUBL: 0.4000 mg | SUBLINGUAL_TABLET | SUBLINGUAL | Status: DC | PRN

## 2012-02-14 SURGICAL SUPPLY — 44 items
BENZOIN TINCTURE PRP APPL 2/3 (GAUZE/BANDAGES/DRESSINGS) ×2 IMPLANT
BLADE SURG 10 STRL SS (BLADE) ×2 IMPLANT
BLADE SURG 15 STRL LF DISP TIS (BLADE) ×1 IMPLANT
BLADE SURG 15 STRL SS (BLADE) ×1
BLADE SURG ROTATE 9660 (MISCELLANEOUS) ×2 IMPLANT
CHLORAPREP W/TINT 26ML (MISCELLANEOUS) ×2 IMPLANT
CLOTH BEACON ORANGE TIMEOUT ST (SAFETY) ×2 IMPLANT
COVER SURGICAL LIGHT HANDLE (MISCELLANEOUS) ×2 IMPLANT
DRAIN PENROSE 1/2X12 LTX STRL (WOUND CARE) ×2 IMPLANT
DRAPE INCISE IOBAN 66X45 STRL (DRAPES) ×2 IMPLANT
DRAPE LAPAROTOMY TRNSV 102X78 (DRAPE) ×2 IMPLANT
DRESSING TELFA 8X3 (GAUZE/BANDAGES/DRESSINGS) ×2 IMPLANT
DRSG OPSITE 6X11 MED (GAUZE/BANDAGES/DRESSINGS) ×2 IMPLANT
ELECT CAUTERY BLADE 6.4 (BLADE) ×2 IMPLANT
ELECT REM PT RETURN 9FT ADLT (ELECTROSURGICAL) ×2
ELECTRODE REM PT RTRN 9FT ADLT (ELECTROSURGICAL) ×1 IMPLANT
GLOVE BIOGEL PI IND STRL 7.0 (GLOVE) ×3 IMPLANT
GLOVE BIOGEL PI IND STRL 8 (GLOVE) ×1 IMPLANT
GLOVE BIOGEL PI INDICATOR 7.0 (GLOVE) ×3
GLOVE BIOGEL PI INDICATOR 8 (GLOVE) ×1
GLOVE ECLIPSE 6.5 STRL STRAW (GLOVE) ×2 IMPLANT
GLOVE ECLIPSE 8.0 STRL XLNG CF (GLOVE) ×2 IMPLANT
GOWN STRL NON-REIN LRG LVL3 (GOWN DISPOSABLE) ×6 IMPLANT
KIT BASIN OR (CUSTOM PROCEDURE TRAY) ×2 IMPLANT
KIT ROOM TURNOVER OR (KITS) ×2 IMPLANT
MESH HERNIA 3X6 (Mesh General) ×2 IMPLANT
NEEDLE HYPO 25GX1X1/2 BEV (NEEDLE) ×2 IMPLANT
NS IRRIG 1000ML POUR BTL (IV SOLUTION) ×2 IMPLANT
PACK SURGICAL SETUP 50X90 (CUSTOM PROCEDURE TRAY) ×2 IMPLANT
PAD ARMBOARD 7.5X6 YLW CONV (MISCELLANEOUS) ×2 IMPLANT
PENCIL BUTTON HOLSTER BLD 10FT (ELECTRODE) ×2 IMPLANT
SPECIMEN JAR SMALL (MISCELLANEOUS) IMPLANT
SPONGE LAP 18X18 X RAY DECT (DISPOSABLE) ×2 IMPLANT
STRIP CLOSURE SKIN 1/2X4 (GAUZE/BANDAGES/DRESSINGS) ×2 IMPLANT
SUT MON AB 4-0 PC3 18 (SUTURE) ×2 IMPLANT
SUT PROLENE 2 0 CT2 30 (SUTURE) ×4 IMPLANT
SUT SILK 2 0 SH (SUTURE) IMPLANT
SUT VIC AB 2-0 SH 18 (SUTURE) ×2 IMPLANT
SUT VIC AB 3-0 SH 27 (SUTURE) ×2
SUT VIC AB 3-0 SH 27XBRD (SUTURE) ×2 IMPLANT
SUT VICRYL AB 3 0 TIES (SUTURE) ×2 IMPLANT
SYR CONTROL 10ML LL (SYRINGE) ×2 IMPLANT
TOWEL OR 17X24 6PK STRL BLUE (TOWEL DISPOSABLE) ×2 IMPLANT
TOWEL OR 17X26 10 PK STRL BLUE (TOWEL DISPOSABLE) ×2 IMPLANT

## 2012-02-14 NOTE — Preoperative (Signed)
Beta Blockers   Reason not to administer Beta Blockers:Metoprolol taken at 0800 this am

## 2012-02-14 NOTE — Anesthesia Postprocedure Evaluation (Signed)
  Anesthesia Post-op Note  Patient: Roberto Nelson  Procedure(s) Performed: Procedure(s) (LRB) with comments: HERNIA REPAIR INGUINAL ADULT (Right) INSERTION OF MESH (Right)  Patient Location: PACU  Anesthesia Type:General  Level of Consciousness: awake, alert  and oriented  Airway and Oxygen Therapy: Patient Spontanous Breathing and Patient connected to nasal cannula oxygen  Post-op Pain: mild  Post-op Assessment: Post-op Vital signs reviewed, Patient's Cardiovascular Status Stable and Respiratory Function Stable  Post-op Vital Signs: stable  Complications: No apparent anesthesia complications

## 2012-02-14 NOTE — Anesthesia Procedure Notes (Addendum)
Procedure Name: Intubation Date/Time: 02/14/2012 12:27 PM Performed by: Rogelia Boga Pre-anesthesia Checklist: Patient identified, Emergency Drugs available, Suction available, Timeout performed and Patient being monitored Patient Re-evaluated:Patient Re-evaluated prior to inductionOxygen Delivery Method: Circle system utilized Preoxygenation: Pre-oxygenation with 100% oxygen Intubation Type: IV induction Ventilation: Mask ventilation without difficulty and Oral airway inserted - appropriate to patient size Laryngoscope Size: Mac and 4 Grade View: Grade II Tube type: Oral Tube size: 7.5 mm Number of attempts: 1 Airway Equipment and Method: Stylet Placement Confirmation: ETT inserted through vocal cords under direct vision,  positive ETCO2 and breath sounds checked- equal and bilateral Secured at: 22 cm Tube secured with: Tape Dental Injury: Teeth and Oropharynx as per pre-operative assessment       Narrative:

## 2012-02-14 NOTE — H&P (Signed)
Roberto Nelson is an 78 y.o. male.   Chief Complaint:   Here for elective right inguinal hernia (RIH) repair HPI: He has a large, chronically incarcerated RIH extending into his scrotum.  This has been there for many years.  He is here for elective repair.  Past Medical History  Diagnosis Date  . Edema   . Atherosclerosis   . Tobacco user   . Heart murmur   . Anginal pain   . Hypertension     [place]   . Shortness of breath     WITH EXERTION   . Cough     Past Surgical History  Procedure Date  . Colonoscopy   . Cardiac catheterization     2005 IN [place]   . Coronary angioplasty     No family history on file. Social History:  reports that he has been smoking Cigarettes.  He has been smoking about .5 packs per day. He does not have any smokeless tobacco history on file. He reports that he drinks alcohol. He reports that he does not use illicit drugs.  Allergies:  Allergies  Allergen Reactions  . Penicillins Itching    Medications Prior to Admission  Medication Sig Dispense Refill  . aspirin EC 81 MG tablet Take 81 mg by mouth daily.      . furosemide (LASIX) 40 MG tablet Take 1 tablet (40 mg total) by mouth daily.  30 tablet  6  . lisinopril (PRINIVIL,ZESTRIL) 40 MG tablet Take 1 tablet (40 mg total) by mouth daily.  30 tablet  6  . metoprolol (TOPROL-XL) 50 MG 24 hr tablet Take 50 mg by mouth daily.        . nitroGLYCERIN (NITROSTAT) 0.4 MG SL tablet Place 1 tablet (0.4 mg total) under the tongue as needed.  100 tablet  3    No results found for this or any previous visit (from the past 48 hour(s)). No results found.  Review of Systems  Constitutional: Negative for fever and chills.  Gastrointestinal: Negative for abdominal pain.    Blood pressure 221/93, pulse 83, temperature 97.6 F (36.4 C), temperature source Oral, resp. rate 18, SpO2 98.00%. Physical Exam  Constitutional: He appears well-developed and well-nourished.  HENT:  Head:  Normocephalic and atraumatic.  Cardiovascular: Normal rate and regular rhythm.   Respiratory: Effort normal and breath sounds normal.  GI: Soft. He exhibits no mass. There is no tenderness.  Genitourinary:       Large right inguinal bulge extending into scrotum-not reducible.  Musculoskeletal: He exhibits no edema.     Assessment/Plan Large chronically incarcerated right inguinal hernia extending into scrotum.  Cardiac clearance obtained.  Plan  Open repair of hernia with mesh and an overnight stay.  I have explained the procedure, risks, and aftercare of inguinal hernia repair. Risks include but are not limited to bleeding, infection, wound problems, anesthesia, recurrence, bladder or intestine injury, urinary retention, testicular dysfunction, chronic pain, mesh problems. He seems to understand and agrees with the plan.    Nicole Defino J 02/14/2012, 12:09 PM

## 2012-02-14 NOTE — Interval H&P Note (Signed)
History and Physical Interval Note:  02/14/2012 12:12 PM  Roberto Nelson  has presented today for surgery, with the diagnosis of Right inguinal hernia  The various methods of treatment have been discussed with the patient and family. After consideration of risks, benefits and other options for treatment, the patient has consented to  Procedure(s) (LRB) with comments: HERNIA REPAIR INGUINAL ADULT (Right) INSERTION OF MESH (Right) as a surgical intervention .  The patient's history has been reviewed, patient examined, no change in status, stable for surgery.  I have reviewed the patient's chart and labs.  Questions were answered to the patient's satisfaction.     Charita Lindenberger Shela [facility]

## 2012-02-14 NOTE — Anesthesia Preprocedure Evaluation (Addendum)
Anesthesia Evaluation  Patient identified by MRN, date of birth, ID band Patient awake    Reviewed: Allergy & Precautions, H&P , NPO status , Patient's Chart, lab work & pertinent test results  Airway Mallampati: II TM Distance: >3 FB Neck ROM: full    Dental  (+) Edentulous Upper and Edentulous Lower   Pulmonary shortness of breath and with exertion, COPDCurrent Smoker,          Cardiovascular hypertension, Pt. on medications and Pt. on home beta blockers + angina + CAD     Neuro/Psych    GI/Hepatic   Endo/Other    Renal/GU      Musculoskeletal   Abdominal   Peds  Hematology   Anesthesia Other Findings   Reproductive/Obstetrics                          Anesthesia Physical Anesthesia Plan  ASA: III  Anesthesia Plan: General   Post-op Pain Management:    Induction: Intravenous  Airway Management Planned: LMA  Additional Equipment:   Intra-op Plan:   Post-operative Plan:   Informed Consent: I have reviewed the patients History and Physical, chart, labs and discussed the procedure including the risks, benefits and alternatives for the proposed anesthesia with the patient or authorized representative who has indicated his/her understanding and acceptance.     Plan Discussed with: CRNA, Surgeon and Anesthesiologist  Anesthesia Plan Comments:        Anesthesia Quick Evaluation

## 2012-02-14 NOTE — Op Note (Signed)
Operative Note  Roberto Nelson male 78 y.o. 02/14/2012  PREOPERATIVE DX:  Chronically incarcerated large right inguinal hernia  POSTOPERATIVE DX:  Same  PROCEDURE:  Open repair of chronically incarcerated right inguinal hernia with mesh         Surgeon: Adolph Pollack   Assistants: None  Anesthesia: General endotracheal anesthesia  Indications: This is a 78 year old male with a long-standing right inguinal hernia that has progressively increased in size. A large amount of it extends down into his scrotum. He now presents for repair. The procedure, risks, and after care were discussed with him preoperatively.    Procedure Detail:  He was seen in the holding room in the right groin was marked with my initials. He was brought to the operating room placed supine on the operating table and a general anesthetic was administered. The hair in the groin areas and scrotum was clipped. The groin area and scrotum were sterilely prepped and draped.  Even under general anesthesia, the large hernia could not be reduced. Marcaine solution was infiltrated superficially in the right groin. A right groin incision was made through the skin and subcutaneous tissue until the external oblique aponeurosis was identified. An incision in the external oblique aponeurosis was made toward the pubis medially and then toward the anterior superior iliac spine laterally. Using blunt dissection I identified the internal oblique muscle and aponeurosis superiorly. Inferiorly I dissected the hernia sac free from the external aponeurosis and exposed the shelving edge of the inguinal ligament.  Using some blunt and sharp dissection I was able to reduce the hernia from the scrotum. The testicle did come up into the wound. I subsequently used electrocautery and blunt dissection to dissect the large hernia sac free from the spermatic cord. Adhesions between the sac and the cord were divided. Once I had done this completely I  was able to reduce the sac and its contents through a large indirect hernia defect. The testicle was then placed back down into the scrotum. The testicular vessels and vas deferens were preserved. The ilioinguinal nerve was preserved.   A piece of 3 x 6" polypropylene mesh was brought into the field. It was anchored two centimeters medial to the pubic tubercle with 2-0 Prolene suture.The inferior aspect of the mesh was then anchored to the shelving edge of the inguinal ligament a running 2-0 Prolene suture to a level 2 cm lateral to the internal ring. A slit was cut in the mesh creating 2 tails. These 2 tails were wrapped around the spermatic cord. The superior aspect of the mesh was anchored to the internal oblique aponeurosis with interrupted 2-0 Vicryl sutures. The 2 tails were wrapped around the spermatic cord creating a new internal ring. They were then anchored to the shelving edge of the inguinal ligament a single 2-0 Prolene suture. The tip of a hemostat could be placed a new aperture.  Marcaine solution was infiltrated into the internal oblique aponeurosis and muscle. Hemostasis was adequate. The external oblique aponeurosis was closed over the cord and mesh with running 3-0 Vicryl suture. The subcutaneous tissue was approximated with running 3-0 Vicryl suture. The skin is closed with a 4-0 Monocryl subcuticular stitch. Steri-Strips and a sterile dressing were applied.  He tolerated the procedure well without any apparent complications and was taken to the recovery room in satisfactory condition.  Estimated Blood Loss:  less than 100 mL         Drains: none  Blood Given: none  Specimens: none         Complications:  * No complications entered in OR log *         Disposition: PACU - hemodynamically stable.         Condition: stable

## 2012-02-14 NOTE — Transfer of Care (Signed)
Immediate Anesthesia Transfer of Care Note  Patient: Roberto Nelson  Procedure(s) Performed: Procedure(s) (LRB) with comments: HERNIA REPAIR INGUINAL ADULT (Right) INSERTION OF MESH (Right)  Patient Location: PACU  Anesthesia Type:General  Level of Consciousness: awake, alert , oriented and patient cooperative  Airway & Oxygen Therapy: Patient Spontanous Breathing and Patient connected to nasal cannula oxygen  Post-op Assessment: Report given to PACU RN, Post -op Vital signs reviewed and stable and Patient moving all extremities X 4  Post vital signs: Reviewed and stable  Complications: No apparent anesthesia complications

## 2012-02-15 ENCOUNTER — Ambulatory Visit (HOSPITAL_COMMUNITY): Payer: Medicare Other

## 2012-02-15 ENCOUNTER — Encounter (HOSPITAL_COMMUNITY): Payer: Self-pay | Admitting: General Surgery

## 2012-02-15 MED ORDER — OXYCODONE-ACETAMINOPHEN 5-325 MG PO TABS: 1.0000 | ORAL_TABLET | ORAL | Status: DC | PRN

## 2012-02-15 MED ORDER — ACETAMINOPHEN 325 MG PO TABS
650.0000 mg | ORAL_TABLET | Freq: Four times a day (QID) | ORAL | Status: DC | PRN
  Administered 2012-02-15: 650 mg via ORAL
  Filled 2012-02-15: qty 2

## 2012-02-15 MED ORDER — LACTATED RINGERS IV SOLN
INTRAVENOUS | Status: DC
  Administered 2012-02-15: 10 mL/h via INTRAVENOUS

## 2012-02-15 NOTE — Addendum Note (Signed)
Addendum  created 02/15/12 1050 by Adair Laundry, CRNA   Modules edited:Anesthesia Blocks and Procedures, Charges VN, Inpatient Notes

## 2012-02-15 NOTE — Progress Notes (Signed)
Pt discharged to home instructions explained pt verbalized understanding  

## 2012-02-15 NOTE — Addendum Note (Signed)
Addendum  created 02/15/12 1050 by Adair Laundry, CRNA   Modules edited:Charges VN

## 2012-02-15 NOTE — Progress Notes (Signed)
1 Day Post-Op  Subjective: A little sore.  Voiding okay.  Some fever last night which is better after spirometry and Tylenol.  Objective: Vital signs in last 24 hours: Temp:  [97.3 F (36.3 C)-101.3 F (38.5 C)] 99 F (37.2 C) (12/03 0500) Pulse Rate:  [0-92] 89  (12/03 0500) Resp:  [16-21] 18  (12/03 0500) BP: (120-221)/(55-93) 136/75 mmHg (12/03 0500) SpO2:  [93 %-100 %] 94 % (12/03 0500) Weight:  [168 lb (76.204 kg)] 168 lb (76.204 kg) (12/02 1600)    Intake/Output from previous day: 12/02 0701 - 12/03 0700 In: 2022 [P.O.:360; I.V.:1662] Out: 440 [Urine:440] Intake/Output this shift:    PE: General- In NAD, up in chair GU-Right groin dressing dry, some swelling and bruising in scrotum  Lab Results:  No results found for this basename: WBC:2,HGB:2,HCT:2,PLT:2 in the last 72 hours BMET No results found for this basename: NA:2,K:2,CL:2,CO2:2,GLUCOSE:2,BUN:2,CREATININE:2,CALCIUM:2 in the last 72 hours PT/INR No results found for this basename: LABPROT:2,INR:2 in the last 72 hours Comprehensive Metabolic Panel:    Component Value Date/Time   NA 139 02/03/2012 1038   K 3.8 02/03/2012 1038   CL 98 02/03/2012 1038   CO2 32 02/03/2012 1038   BUN 13 02/03/2012 1038   CREATININE 0.92 02/03/2012 1038   CREATININE 0.86 12/30/2011 1200   GLUCOSE 113* 02/03/2012 1038   CALCIUM 9.3 02/03/2012 1038   AST 21 02/03/2012 1038   ALT 9 02/03/2012 1038   ALKPHOS 75 02/03/2012 1038   BILITOT 0.8 02/03/2012 1038   PROT 7.2 02/03/2012 1038   ALBUMIN 3.4* 02/03/2012 1038     Studies/Results: No results found.  Anti-infectives: Anti-infectives     Start     Dose/Rate Route Frequency Ordered Stop   02/14/12 1030   vancomycin (VANCOCIN) 1 GM/200ML IVPB     Comments: GALLMAN, KATHIE JEAN: cabinet override         02/14/12 1030 02/14/12 1222   02/13/12 1147   vancomycin (VANCOCIN) IVPB 1000 mg/200 mL premix  Status:  Discontinued        1,000 mg 200 mL/hr over 60 Minutes  Intravenous On call to O.R. 02/13/12 1147 02/14/12 1602          Assessment Active Problems:  Post repair of large, chronically incarcerated RIH  Fever-most likely pulmonary in origin  Bilateral lung base shadows-possibly nipples    LOS: 1 day   Plan: CXR with nipple markers.  Discharge today.  Instructions given.   Roberto Nelson J 02/15/2012

## 2012-02-17 ENCOUNTER — Other Ambulatory Visit (INDEPENDENT_AMBULATORY_CARE_PROVIDER_SITE_OTHER): Payer: Self-pay | Admitting: General Surgery

## 2012-02-17 ENCOUNTER — Telehealth (INDEPENDENT_AMBULATORY_CARE_PROVIDER_SITE_OTHER): Payer: Self-pay | Admitting: General Surgery

## 2012-02-17 ENCOUNTER — Encounter (INDEPENDENT_AMBULATORY_CARE_PROVIDER_SITE_OTHER): Payer: Self-pay | Admitting: General Surgery

## 2012-02-17 MED ORDER — ONDANSETRON HCL 4 MG PO TABS: 4.0000 mg | ORAL_TABLET | ORAL | Status: DC | PRN

## 2012-02-17 NOTE — Telephone Encounter (Signed)
Pt's friend called to report the pt has been nauseated since coming home from surgery [facility] on 02/14/12.)   He has only vomited once, in the car on the way home.  Advised him to offer small sips of fluids frequently, especially Gatorade, gingerale, sweet tea, broths, etc.  Once stomach is more settled, begin nibbling saltines, dry toast, and advance diet slowly.  Abdomen is not distended or tight; last BM was Sunday, not passing gas.  Paged and updated Dr. Abbey Chatters.  Ordered Dulcolax suppositories and Zofran.

## 2012-02-17 NOTE — Progress Notes (Signed)
Patient ID: Roberto Nelson, male   DOB: November 16, 1933, 78 y.o.   MRN: [id number] He called today and has been having some problems with nausea. He is not vomiting. He is tolerating liquids but solids make him feel more nauseated. He denies fever. He denies any abdominal distention. He has soreness and swelling down the right groin area. He has not passed gas since the operation. He has not had a bowel movement since the operation which was 3 days ago. I suggested that he try to move around more. I called him in some Zofran for the nausea. I also suggested she take some Dulcolax suppositories try to stimulate a bowel movement.  I told him to call us back if he did not get better.

## 2012-02-21 ENCOUNTER — Telehealth (INDEPENDENT_AMBULATORY_CARE_PROVIDER_SITE_OTHER): Payer: Self-pay

## 2012-02-21 NOTE — Telephone Encounter (Signed)
Patient called in asking when his follow up appointment with [facility] was. I told him he scheduled for 12/17 @ 10:50. He asked if his sutures needed to come out. I told him they will dissolve on there own. No other questions at this time. Patient will call back with anymore questions or concerns.

## 2012-02-24 ENCOUNTER — Inpatient Hospital Stay (HOSPITAL_COMMUNITY)
Admission: EM | Admit: 2012-02-24 | Discharge: 2012-02-26 | DRG: 378 | Disposition: A | Discharge status: Home / Self Care | Payer: Medicare Other | Attending: Internal Medicine | Admitting: Internal Medicine

## 2012-02-24 ENCOUNTER — Encounter (HOSPITAL_COMMUNITY): Payer: Self-pay | Admitting: Emergency Medicine

## 2012-02-24 DIAGNOSIS — I959 Hypotension, unspecified: Secondary | ICD-10-CM | POA: Diagnosis present

## 2012-02-24 DIAGNOSIS — I2510 Atherosclerotic heart disease of native coronary artery without angina pectoris: Secondary | ICD-10-CM | POA: Diagnosis present

## 2012-02-24 DIAGNOSIS — R0602 Shortness of breath: Secondary | ICD-10-CM

## 2012-02-24 DIAGNOSIS — Z880 Allergy status to penicillin: Secondary | ICD-10-CM

## 2012-02-24 DIAGNOSIS — K259 Gastric ulcer, unspecified as acute or chronic, without hemorrhage or perforation: Secondary | ICD-10-CM

## 2012-02-24 DIAGNOSIS — K922 Gastrointestinal hemorrhage, unspecified: Secondary | ICD-10-CM

## 2012-02-24 DIAGNOSIS — I452 Bifascicular block: Secondary | ICD-10-CM | POA: Diagnosis present

## 2012-02-24 DIAGNOSIS — K2970 Gastritis, unspecified, without bleeding: Secondary | ICD-10-CM | POA: Diagnosis present

## 2012-02-24 DIAGNOSIS — R011 Cardiac murmur, unspecified: Secondary | ICD-10-CM | POA: Diagnosis present

## 2012-02-24 DIAGNOSIS — F17200 Nicotine dependence, unspecified, uncomplicated: Secondary | ICD-10-CM | POA: Diagnosis present

## 2012-02-24 DIAGNOSIS — Z9861 Coronary angioplasty status: Secondary | ICD-10-CM

## 2012-02-24 DIAGNOSIS — I10 Essential (primary) hypertension: Secondary | ICD-10-CM | POA: Diagnosis present

## 2012-02-24 DIAGNOSIS — Z8719 Personal history of other diseases of the digestive system: Secondary | ICD-10-CM

## 2012-02-24 DIAGNOSIS — Z7982 Long term (current) use of aspirin: Secondary | ICD-10-CM

## 2012-02-24 DIAGNOSIS — D500 Iron deficiency anemia secondary to blood loss (chronic): Secondary | ICD-10-CM | POA: Diagnosis present

## 2012-02-24 DIAGNOSIS — J449 Chronic obstructive pulmonary disease, unspecified: Secondary | ICD-10-CM

## 2012-02-24 DIAGNOSIS — Z79899 Other long term (current) drug therapy: Secondary | ICD-10-CM

## 2012-02-24 DIAGNOSIS — K921 Melena: Secondary | ICD-10-CM

## 2012-02-24 DIAGNOSIS — K2980 Duodenitis without bleeding: Secondary | ICD-10-CM | POA: Diagnosis present

## 2012-02-24 DIAGNOSIS — K254 Chronic or unspecified gastric ulcer with hemorrhage: Principal | ICD-10-CM | POA: Diagnosis present

## 2012-02-24 DIAGNOSIS — K4090 Unilateral inguinal hernia, without obstruction or gangrene, not specified as recurrent: Secondary | ICD-10-CM

## 2012-02-24 DIAGNOSIS — K449 Diaphragmatic hernia without obstruction or gangrene: Secondary | ICD-10-CM | POA: Diagnosis present

## 2012-02-24 DIAGNOSIS — K2990 Gastroduodenitis, unspecified, without bleeding: Secondary | ICD-10-CM

## 2012-02-24 DIAGNOSIS — E860 Dehydration: Secondary | ICD-10-CM | POA: Diagnosis present

## 2012-02-24 DIAGNOSIS — R600 Localized edema: Secondary | ICD-10-CM

## 2012-02-24 DIAGNOSIS — I9589 Other hypotension: Secondary | ICD-10-CM | POA: Diagnosis present

## 2012-02-24 DIAGNOSIS — Z9889 Other specified postprocedural states: Secondary | ICD-10-CM

## 2012-02-24 DIAGNOSIS — J4489 Other specified chronic obstructive pulmonary disease: Secondary | ICD-10-CM | POA: Diagnosis present

## 2012-02-24 DIAGNOSIS — N390 Urinary tract infection, site not specified: Secondary | ICD-10-CM

## 2012-02-24 HISTORY — DX: Bifascicular block: I45.2

## 2012-02-24 HISTORY — DX: Chronic obstructive pulmonary disease, unspecified: J44.9

## 2012-02-24 HISTORY — DX: Atherosclerotic heart disease of native coronary artery without angina pectoris: I25.10

## 2012-02-24 HISTORY — DX: Anemia, unspecified: D64.9

## 2012-02-24 HISTORY — DX: Gastric ulcer, unspecified as acute or chronic, without hemorrhage or perforation: K25.9

## 2012-02-24 HISTORY — DX: Unilateral inguinal hernia, with obstruction, without gangrene, not specified as recurrent: K40.30

## 2012-02-24 HISTORY — DX: Gastroduodenitis, unspecified, without bleeding: K29.90

## 2012-02-24 HISTORY — DX: Nicotine dependence, unspecified, uncomplicated: F17.200

## 2012-02-24 LAB — COMPREHENSIVE METABOLIC PANEL
ALT: 9 U/L (ref 0–53)
BUN: 30 mg/dL — ABNORMAL HIGH (ref 6–23)
Calcium: 8.4 mg/dL (ref 8.4–10.5)
GFR calc Af Amer: 70 mL/min — ABNORMAL LOW (ref >90)
Glucose, Bld: 117 mg/dL — ABNORMAL HIGH (ref 70–99)
Sodium: 134 mEq/L — ABNORMAL LOW (ref 135–145)
Total Protein: 5.7 g/dL — ABNORMAL LOW (ref 6.0–8.3)

## 2012-02-24 LAB — CBC WITH DIFFERENTIAL/PLATELET
Eosinophils Absolute: 0.1 10*3/uL (ref 0.0–0.7)
Eosinophils Relative: 1 % (ref 0–5)
Lymphs Abs: 1.4 10*3/uL (ref 0.7–4.0)
MCH: 33.3 pg (ref 26.0–34.0)
MCV: 99.4 fL (ref 78.0–100.0)
Platelets: 312 10*3/uL (ref 150–400)
RBC: 1.71 MIL/uL — ABNORMAL LOW (ref 4.22–5.81)

## 2012-02-24 LAB — OCCULT BLOOD, POC DEVICE: Fecal Occult Bld: POSITIVE — AB

## 2012-02-24 LAB — APTT: aPTT: 29 seconds (ref 24–37)

## 2012-02-24 LAB — PROTIME-INR
INR: 1.04 (ref 0.00–1.49)
Prothrombin Time: 13.5 seconds (ref 11.6–15.2)

## 2012-02-24 MED ORDER — ALBUTEROL SULFATE (5 MG/ML) 0.5% IN NEBU: 2.5000 mg | INHALATION_SOLUTION | RESPIRATORY_TRACT | Status: DC | PRN

## 2012-02-24 MED ORDER — MORPHINE SULFATE 2 MG/ML IJ SOLN
2.0000 mg | INTRAMUSCULAR | Status: DC | PRN
  Administered 2012-02-26: 2 mg via INTRAVENOUS
  Filled 2012-02-24: qty 1

## 2012-02-24 MED ORDER — POTASSIUM CHLORIDE IN NACL 20-0.9 MEQ/L-% IV SOLN
INTRAVENOUS | Status: DC
  Administered 2012-02-24 – 2012-02-25 (×2): via INTRAVENOUS

## 2012-02-24 MED ORDER — GUAIFENESIN-DM 100-10 MG/5ML PO SYRP: 5.0000 mL | ORAL_SOLUTION | ORAL | Status: DC | PRN

## 2012-02-24 MED ORDER — SODIUM CHLORIDE 0.9 % IV BOLUS (SEPSIS)
1000.0000 mL | Freq: Once | INTRAVENOUS | Status: AC
  Administered 2012-02-24: 1000 mL via INTRAVENOUS

## 2012-02-24 MED ORDER — IPRATROPIUM BROMIDE 0.02 % IN SOLN
0.5000 mg | Freq: Four times a day (QID) | RESPIRATORY_TRACT | Status: DC
  Administered 2012-02-24 – 2012-02-26 (×7): 0.5 mg via RESPIRATORY_TRACT
  Filled 2012-02-24 (×8): qty 2.5

## 2012-02-24 MED ORDER — ALUM & MAG HYDROXIDE-SIMETH 200-200-20 MG/5ML PO SUSP: 30.0000 mL | Freq: Four times a day (QID) | ORAL | Status: DC | PRN

## 2012-02-24 MED ORDER — PANTOPRAZOLE SODIUM 40 MG IV SOLR
40.0000 mg | Freq: Two times a day (BID) | INTRAVENOUS | Status: DC
  Administered 2012-02-25: 40 mg via INTRAVENOUS
  Filled 2012-02-24: qty 40

## 2012-02-24 MED ORDER — ONDANSETRON HCL 4 MG PO TABS: 4.0000 mg | ORAL_TABLET | Freq: Four times a day (QID) | ORAL | Status: DC | PRN

## 2012-02-24 MED ORDER — ACETAMINOPHEN 650 MG RE SUPP: 650.0000 mg | Freq: Four times a day (QID) | RECTAL | Status: DC | PRN

## 2012-02-24 MED ORDER — ACETAMINOPHEN 325 MG PO TABS: 650.0000 mg | ORAL_TABLET | Freq: Four times a day (QID) | ORAL | Status: DC | PRN

## 2012-02-24 MED ORDER — LEVALBUTEROL HCL 0.63 MG/3ML IN NEBU: 0.6300 mg | INHALATION_SOLUTION | Freq: Three times a day (TID) | RESPIRATORY_TRACT | Status: DC

## 2012-02-24 MED ORDER — ONDANSETRON HCL 4 MG/2ML IJ SOLN: 4.0000 mg | Freq: Four times a day (QID) | INTRAMUSCULAR | Status: DC | PRN

## 2012-02-24 MED ORDER — ALBUTEROL SULFATE (5 MG/ML) 0.5% IN NEBU: 2.5000 mg | INHALATION_SOLUTION | Freq: Four times a day (QID) | RESPIRATORY_TRACT | Status: DC

## 2012-02-24 MED ORDER — LEVALBUTEROL HCL 0.63 MG/3ML IN NEBU
0.6300 mg | INHALATION_SOLUTION | Freq: Four times a day (QID) | RESPIRATORY_TRACT | Status: DC
  Administered 2012-02-24 – 2012-02-26 (×7): 0.63 mg via RESPIRATORY_TRACT
  Filled 2012-02-24 (×8): qty 3

## 2012-02-24 MED ORDER — PANTOPRAZOLE SODIUM 40 MG IV SOLR
40.0000 mg | Freq: Once | INTRAVENOUS | Status: AC
  Administered 2012-02-24: 40 mg via INTRAVENOUS
  Filled 2012-02-24: qty 40

## 2012-02-24 NOTE — ED Notes (Signed)
Pt states had hernia repair on December 2. Pt states was released and has had black stool since. Pt increased weakness, pale skin and unable to eat or drink.

## 2012-02-24 NOTE — ED Provider Notes (Signed)
History    This chart was scribed for Charles B. Bernette Mayers, MD, MD by Smitty Pluck, ED Scribe. The patient was seen in room APA18 and the patient's care was started at 2:49PM.   CSN: [id number]  Arrival date & time 02/24/12  1426      Chief Complaint  Patient presents with  . Rectal Bleeding  . Abdominal Pain    (Consider location/radiation/quality/duration/timing/severity/associated sxs/prior treatment) The history is provided by the patient. No language interpreter was used.   Roberto Nelson is a 78 y.o. male who presents to the Emergency Department complaining of constant, moderate rectal bleeding onset 10 days ago. He reports watery diarrhea and dark blood in stool. He reports mild lower abdominal pain, nausea, weakness and mild SOB. Pt reports that he had hernia repair on December 2. He reports that he stopped taking his blood thinner due to hernia operation. Pt denies fever, chills, cough, chest pain and any other pain.   Past Medical History  Diagnosis Date  . Edema   . Atherosclerosis   . Tobacco user   . Heart murmur   . Anginal pain   . Hypertension     [place]   . Shortness of breath     WITH EXERTION   . Cough     Past Surgical History  Procedure Date  . Colonoscopy   . Cardiac catheterization     2005 IN [place]   . Coronary angioplasty   . Inguinal hernia repair 02/14/2012    Procedure: HERNIA REPAIR INGUINAL ADULT;  Surgeon: Adolph Pollack, MD;  Location: [facility] OR;  Service: General;  Laterality: Right;  . Insertion of mesh 02/14/2012    Procedure: INSERTION OF MESH;  Surgeon: Adolph Pollack, MD;  Location: [facility] OR;  Service: General;  Laterality: Right;    History reviewed. No pertinent family history.  History  Substance Use Topics  . Smoking status: Current Every Day Smoker -- 0.5 packs/day    Types: Cigarettes  . Smokeless tobacco: Not on file  . Alcohol Use: Yes     Comment: BEER      Review of Systems  All other  systems reviewed and are negative.   10 Systems reviewed and all are negative for acute change except as noted in the HPI.   Allergies  Penicillins  Home Medications   Current Outpatient Rx  Name  Route  Sig  Dispense  Refill  . ASPIRIN EC 81 MG PO TBEC   Oral   Take 81 mg by mouth daily.         . FUROSEMIDE 40 MG PO TABS   Oral   Take 1 tablet (40 mg total) by mouth daily.   30 tablet   6   . LISINOPRIL 40 MG PO TABS   Oral   Take 1 tablet (40 mg total) by mouth daily.   30 tablet   6   . METOPROLOL SUCCINATE ER 50 MG PO TB24   Oral   Take 50 mg by mouth daily.           Marland Kitchen NITROGLYCERIN 0.4 MG SL SUBL   Sublingual   Place 1 tablet (0.4 mg total) under the tongue as needed.   100 tablet   3   . ONDANSETRON HCL 4 MG PO TABS   Oral   Take 1 tablet (4 mg total) by mouth every 4 (four) hours as needed for nausea.   30 tablet   0   .  OXYCODONE-ACETAMINOPHEN 5-325 MG PO TABS   Oral   Take 1-2 tablets by mouth every 4 (four) hours as needed for pain (take 1-2 tabs q4hrs as needed for pain).   30 tablet   0     BP 88/42  Pulse 102  Temp 97.3 F (36.3 C) (Oral)  Resp 22  Wt 168 lb (76.204 kg)  SpO2 100%  Physical Exam  Nursing note and vitals reviewed. Constitutional: He is oriented to person, place, and time. He appears well-developed and well-nourished.  HENT:  Head: Normocephalic and atraumatic.  Eyes: EOM are normal. Pupils are equal, round, and reactive to light.  Neck: Normal range of motion. Neck supple.  Cardiovascular: Normal rate, normal heart sounds and intact distal pulses.   Pulmonary/Chest: Effort normal and breath sounds normal.  Abdominal: Bowel sounds are normal. He exhibits no distension. There is no tenderness.       Right inguinal hernia incision healing well no signs of infection  Surrounding hematoma   Genitourinary:       Black stool hem positive   Musculoskeletal: Normal range of motion. He exhibits no edema and no  tenderness.  Neurological: He is alert and oriented to person, place, and time. He has normal strength. No cranial nerve deficit or sensory deficit.  Skin: Skin is warm and dry. No rash noted.  Psychiatric: He has a normal mood and affect.    ED Course  Procedures (including critical care time) DIAGNOSTIC STUDIES: Oxygen Saturation is 100% on room air, normal by my interpretation.    COORDINATION OF CARE: 2:53 PM Discussed ED treatment with pt  2:56 PM Ordered: IVF, IV x 2      Labs Reviewed  CBC WITH DIFFERENTIAL - Abnormal; Notable for the following:    WBC 11.4 (*)     RBC 1.71 (*)     Hemoglobin 5.7 (*)     HCT 17.0 (*)  RESULT REPEATED AND VERIFIED   Neutrophils Relative 80 (*)     Neutro Abs 9.2 (*)     All other components within normal limits  COMPREHENSIVE METABOLIC PANEL - Abnormal; Notable for the following:    Sodium 134 (*)     Glucose, Bld 117 (*)     BUN 30 (*)     Total Protein 5.7 (*)     Albumin 2.5 (*)     GFR calc non Af Amer 61 (*)     GFR calc Af Amer 70 (*)     All other components within normal limits  PROTIME-INR  APTT  TYPE AND SCREEN  PREPARE RBC (CROSSMATCH)  ABO/RH   No results found.   No diagnosis found.    MDM   Date: 02/24/2012  Rate: 101  Rhythm: sinus tachycardia  QRS Axis: left  Intervals: normal  ST/T Wave abnormalities: nonspecific T wave changes  Conduction Disutrbances:right bundle branch block and left anterior fascicular block  Narrative Interpretation:   Old EKG Reviewed: unchanged   Marked anemia without coagulopathy. Will begin transfusion, plan admission for further eval. Pt's BP and HR have normalized. Resting comfortably in bed.    Admit to Hospitalist for treatment.   I personally performed the services described in this documentation, which was scribed in my presence. The recorded information has been reviewed and is accurate.     Charles B. Bernette Mayers, MD 02/24/12 [phone]

## 2012-02-24 NOTE — ED Notes (Signed)
CRITICAL VALUE ALERT  Critical value received:  Hgb 5.7; Hct 17.0  Date of notification:  02/24/12  Time of notification:  1546  Critical value read back:yes  Nurse who received alert:  Santiago Bur, RN  MD notified (1st page):  Dr. Susy Frizzle  Time of first page:  1546  MD notified (2nd page):  Time of second page:  Responding MD:  Dr. Susy Frizzle  Time MD responded:  [phone]

## 2012-02-24 NOTE — H&P (Signed)
[facility] History and Physical  Roberto Nelson ZOX:[id number] DOB: [birth year] DOA: 02/24/2012  Referring physician: Dr. Sarita Haver PCP: Dwana Melena, MD  Specialists: General surgeon: Dr. Abbey Chatters; cardiologist: Dr. Daleen Squibb.  Chief Complaint: black tarry stools  HPI: Roberto Nelson is a 78 y.o. male  with a history significant for coronary artery disease, hypertension, and recent right incarcerated hernia, status post hernia repair by Dr. Abbey Chatters on 02/14/2012, who presents to the hospital today with a chief complaint of black stools. He says that the black tarry stools started shortly after his inguinal hernia repair surgery. He noticed it more when he returned home. He's had several bowel movements over the past week and a half that were black. He denies bright red blood per rectum over the last couple of weeks, but he did see red blood in his stools a few months ago. He has intermittent abdominal pain which he describes as "burning". It radiates from the epigastrium to the lower abdomen. No radiation to the back. He has been nauseated, particularly when he tries to be solid foods, but he is able to drink liquids without too much nausea. He denies vomiting of gastric contents, but he has had quite a bit of mucus that he has been coughing up. No red blood in his sputum. He has had intermittent central chest pain, particularly when he lays flat and shortness of breath when he ambulates. He denies chest pain currently. He denies pleurisy. He denies cough. He has had chronic swelling in both of his legs and takes Lasix for it. He has also had intermittent lightheadedness and easy fatigability. He denies taking any NSAIDs, other than aspirin. He has not taken it in more than 2 weeks because of the hernia repair.  In the emergency department, he was initially hypotensive with a blood pressure of 80/42. Following IV fluids, his blood pressure improved 139/71. His lab data are significant for  WBC of 11.4, hemoglobin of 5.7, hematocrit of 17.0, sodium of 134, and BUN of 30. His stool is guaiac positive. He is being admitted for further evaluation and management.    Review of Systems: As above in history present illness, otherwise negative.  Past Medical History  Diagnosis Date  . Edema   . Atherosclerosis   . Tobacco user   . Heart murmur   . Anginal pain   . Hypertension     [place]  [place]   . Shortness of breath     WITH EXERTION   . Cough   . CORONARY ATHEROSCLEROSIS NATIVE CORONARY ARTERY 11/03/2009    Qualifier: Diagnosis of  By: Via LPN, Larita Fife    . COPD 11/03/2009    Qualifier: Diagnosis of  By: Via LPN, Larita Fife    . TOBACCO ABUSE 11/03/2009    Qualifier: Diagnosis of  By: Daleen Squibb, MD, Lorenza [place] Incarcerated inguinal hernia, unilateral 02/14/2012    Status post repair, per Dr. Abbey Chatters  . Right bundle branch block and left anterior fascicular block 12/30/2011   Past Surgical History  Procedure Date  . Colonoscopy   . Cardiac catheterization     2005 IN [place]   . Coronary angioplasty   . Inguinal hernia repair 02/14/2012    Procedure: HERNIA REPAIR INGUINAL ADULT;  Surgeon: Adolph Pollack, MD;  Location: [facility] OR;  Service: General;  Laterality: Right;  . Insertion of mesh 02/14/2012    Procedure: INSERTION OF MESH;  Surgeon: Adolph Pollack, MD;  Location:  [facility] OR;  Service: General;  Laterality: Right;   Social History:  The patient is widowed. He had one daughter who died of a drug overdose. He lives alone. He is retired. He smoked one pack of cigarettes per day for many years, but recently cut down to one to 2 per week. He denies illicit drug use. He takes beer on occasion. He still drives.   Allergies  Allergen Reactions  . Penicillins Itching    Family history: Both of his parents died of old age in their 31s. He does not recall any chronic medical illnesses.  Prior to Admission medications   Medication Sig Start Date End Date Taking?  Authorizing Provider  aspirin EC 81 MG tablet Take 81 mg by mouth daily.   Yes Historical Provider, MD  furosemide (LASIX) 40 MG tablet Take 1 tablet (40 mg total) by mouth daily. 12/30/11  Yes Gaylord Shih, MD  lisinopril (PRINIVIL,ZESTRIL) 40 MG tablet Take 1 tablet (40 mg total) by mouth daily. 12/30/11  Yes Gaylord Shih, MD  metoprolol (TOPROL-XL) 50 MG 24 hr tablet Take 50 mg by mouth daily.     Yes Historical Provider, MD  nitroGLYCERIN (NITROSTAT) 0.4 MG SL tablet Place 1 tablet (0.4 mg total) under the tongue as needed. 12/30/11  Yes Gaylord Shih, MD  ondansetron (ZOFRAN) 4 MG tablet Take 1 tablet (4 mg total) by mouth every 4 (four) hours as needed for nausea. 02/17/12  Yes Adolph Pollack, MD  oxyCODONE-acetaminophen (ROXICET) 5-325 MG per tablet Take 1-2 tablets by mouth every 4 (four) hours as needed for pain (take 1-2 tabs q4hrs as needed for pain). 02/15/12  Yes Blenda Mounts, NP   Physical Exam: Filed Vitals:   02/24/12 1600 02/24/12 1700 02/24/12 1717 02/24/12 1720  BP: 127/58 132/42 138/54 138/54  Pulse: 78 81 84   Temp:  98.1 F (36.7 C) 98.1 F (36.7 C) 98.4 F (36.9 C)  TempSrc:  Oral Oral Oral  Resp: 24 26 20    Height:   5\' 7"  (1.702 m)   Weight:   70 kg (154 lb 5.2 oz)   SpO2: 94%  100%      General:  Pleasant alert 78 year old Caucasian man laying in bed, in no acute distress.  Eyes: pupils are equal, round, and reactive to light. Extraocular movements are intact. Conjunctivae are clear, sclerae are white, pale.  ENT: oropharynx reveals no teeth. Mucous membranes are dry. Pallor. No posterior exudates or erythema.  Neck: supple, no adenopathy, no thyromegaly, no JVD.  Cardiovascular: S1, S2, with no murmurs rubs or gallops.  Respiratory: occasional wheezes, breathing nonlabored.  Abdomen: positive bowel sounds, soft, mild tenderness epigastrium, nondistended, no masses palpated.  Rectal: Deferred. Report per ED physician reveals black stool that is  heme positive.  Right inguinal region: Incision/operative site healing with surrounding hematoma and associated ecchymosis. Mildly tender. No active drainage.  Skin: pale. Fair turgor.  Musculoskeletal: no acute hot red joints.  Psychiatric: pleasant affect. He is alert and oriented x3. Cooperative.  Neurologic: cranial nerves II through XII are intact. Strength is 5 over 5 throughout in the supine position. Sensation is grossly intact.  Labs on Admission:  Basic Metabolic Panel:  Lab 02/24/12 1610  NA 134*  K 3.8  CL 99  CO2 30  GLUCOSE 117*  BUN 30*  CREATININE 1.14  CALCIUM 8.4  MG --  PHOS --   Liver Function Tests:  Lab 02/24/12 1500  AST 13  ALT 9  ALKPHOS 56  BILITOT 0.6  PROT 5.7*  ALBUMIN 2.5*   No results found for this basename: LIPASE:5,AMYLASE:5 in the last 168 hours No results found for this basename: AMMONIA:5 in the last 168 hours CBC:  Lab 02/24/12 1500  WBC 11.4*  NEUTROABS 9.2*  HGB 5.7*  HCT 17.0*  MCV 99.4  PLT 312   Cardiac Enzymes: No results found for this basename: CKTOTAL:5,CKMB:5,CKMBINDEX:5,TROPONINI:5 in the last 168 hours  BNP (last 3 results) No results found for this basename: PROBNP:3 in the last 8760 hours CBG: No results found for this basename: GLUCAP:5 in the last 168 hours  Radiological Exams on Admission: No results found.  EKG: sinus tachycardia with a heart rate of 101 beats per minute and bifascicular block. (Compared with previous EKG, no significant changes.)  Assessment/Plan Principal Problem:  *GI bleed Active Problems:  Melena  Anemia due to chronic blood loss  CORONARY ATHEROSCLEROSIS NATIVE CORONARY ARTERY  COPD  Hypotension  S/P right inguinal hernia repair   1. This is a 78 year old man who is status post right inguinal hernia repair with mesh 10 days ago for an incarcerated hernia, who presents with a GI bleed. The patient has melena and reports no recent bright red blood per rectum. His only  chronic NSAID is aspirin. He denies any other NSAIDs. He has had a colonoscopy in the past, but he does not recall the results. It was done in [place]. His hypotension is secondary to hypovolemia and subacute/chronic blood loss. I would not characterize his hypotension as shock. It improved rather quickly with IV fluids in the emergency department. I expect that his hemoglobin will decrease further with IV fluids and equilibration, however, the ED physician has already ordered transfusion of 2 units of packed red blood cells. Currently, he is asymptomatic and hemodynamically stable. His EKG is abnormal, but he has chronic bundle branch block on previous EKG. He denies chest pain.     Plan. 1. We'll admit the patient to the step down unit. 2. The patient received 1 L bolus of normal saline in the ED. We'll continue IV fluid hydration. 3. Transfuse 2 units of packed red blood cells as ordered. We'll check a followup hemoglobin/hematocrit following the transfusion. 4. Start Protonix IV every 12 hours. 5. We'll order ferritin and total iron (add to the blood drawn in the ED). 6. I've consulted gastroenterologist, Dr. Jena Gauss. He will see the patient in the morning. In the meantime, the patient will be allowed ice chips and then n.p.o. after midnight.  Code Status:  full code Family Communication: none available Disposition Plan: plan discharge to home when clinically improved.  Time spent: one hour.  [facility] Pager [phone].  If 7PM-7AM, please contact night-coverage [email or web address] Password [facility] 02/24/2012, 6:33 PM

## 2012-02-25 ENCOUNTER — Encounter (HOSPITAL_COMMUNITY)
Admission: EM | Disposition: A | Discharge status: Home / Self Care | Payer: Self-pay | Source: Home / Self Care | Attending: Internal Medicine

## 2012-02-25 ENCOUNTER — Encounter (HOSPITAL_COMMUNITY): Payer: Self-pay | Admitting: Gastroenterology

## 2012-02-25 DIAGNOSIS — R112 Nausea with vomiting, unspecified: Secondary | ICD-10-CM

## 2012-02-25 DIAGNOSIS — K921 Melena: Secondary | ICD-10-CM

## 2012-02-25 DIAGNOSIS — K259 Gastric ulcer, unspecified as acute or chronic, without hemorrhage or perforation: Secondary | ICD-10-CM

## 2012-02-25 DIAGNOSIS — J449 Chronic obstructive pulmonary disease, unspecified: Secondary | ICD-10-CM

## 2012-02-25 DIAGNOSIS — D500 Iron deficiency anemia secondary to blood loss (chronic): Secondary | ICD-10-CM

## 2012-02-25 DIAGNOSIS — K2990 Gastroduodenitis, unspecified, without bleeding: Secondary | ICD-10-CM | POA: Diagnosis present

## 2012-02-25 HISTORY — PX: ESOPHAGOGASTRODUODENOSCOPY: SHX5428

## 2012-02-25 HISTORY — DX: Gastric ulcer, unspecified as acute or chronic, without hemorrhage or perforation: K25.9

## 2012-02-25 HISTORY — DX: Gastroduodenitis, unspecified, without bleeding: K29.90

## 2012-02-25 LAB — COMPREHENSIVE METABOLIC PANEL
ALT: 7 U/L (ref 0–53)
CO2: 30 mEq/L (ref 19–32)
Calcium: 8.1 mg/dL — ABNORMAL LOW (ref 8.4–10.5)
Creatinine, Ser: 1 mg/dL (ref 0.50–1.35)
GFR calc Af Amer: 82 mL/min — ABNORMAL LOW (ref >90)
GFR calc non Af Amer: 71 mL/min — ABNORMAL LOW (ref >90)
Glucose, Bld: 88 mg/dL (ref 70–99)
Sodium: 137 mEq/L (ref 135–145)
Total Protein: 5 g/dL — ABNORMAL LOW (ref 6.0–8.3)

## 2012-02-25 LAB — CBC
MCH: 32.9 pg (ref 26.0–34.0)
MCHC: 34.1 g/dL (ref 30.0–36.0)
MCV: 96.3 fL (ref 78.0–100.0)
Platelets: 252 10*3/uL (ref 150–400)
RBC: 2.19 MIL/uL — ABNORMAL LOW (ref 4.22–5.81)

## 2012-02-25 LAB — IRON AND TIBC
Iron: 38 ug/dL — ABNORMAL LOW (ref 42–135)
Saturation Ratios: 19 % — ABNORMAL LOW (ref 20–55)
TIBC: 203 ug/dL — ABNORMAL LOW (ref 215–435)

## 2012-02-25 LAB — PREPARE RBC (CROSSMATCH)

## 2012-02-25 SURGERY — EGD (ESOPHAGOGASTRODUODENOSCOPY)
Anesthesia: Moderate Sedation

## 2012-02-25 MED ORDER — BUTAMBEN-TETRACAINE-BENZOCAINE 2-2-14 % EX AERO
INHALATION_SPRAY | CUTANEOUS | Status: DC | PRN
  Administered 2012-02-25: 2 via TOPICAL

## 2012-02-25 MED ORDER — SODIUM CHLORIDE 0.9 % IV SOLN: INTRAVENOUS | Status: DC

## 2012-02-25 MED ORDER — FUROSEMIDE 10 MG/ML IJ SOLN
10.0000 mg | Freq: Once | INTRAMUSCULAR | Status: AC
  Administered 2012-02-25: 10 mg via INTRAVENOUS
  Filled 2012-02-25: qty 2

## 2012-02-25 MED ORDER — PANTOPRAZOLE SODIUM 40 MG IV SOLR
40.0000 mg | Freq: Two times a day (BID) | INTRAVENOUS | Status: DC
  Administered 2012-02-25 – 2012-02-26 (×2): 40 mg via INTRAVENOUS
  Filled 2012-02-25 (×2): qty 40

## 2012-02-25 MED ORDER — MIDAZOLAM HCL 5 MG/5ML IJ SOLN
INTRAMUSCULAR | Status: DC | PRN
  Administered 2012-02-25: 2 mg via INTRAVENOUS
  Administered 2012-02-25 (×2): 1 mg via INTRAVENOUS

## 2012-02-25 MED ORDER — SODIUM CHLORIDE 0.45 % IV SOLN
INTRAVENOUS | Status: DC
  Administered 2012-02-25: 09:00:00 via INTRAVENOUS

## 2012-02-25 MED ORDER — MIDAZOLAM HCL 5 MG/5ML IJ SOLN
INTRAMUSCULAR | Status: AC
  Filled 2012-02-25: qty 10

## 2012-02-25 MED ORDER — MEPERIDINE HCL 100 MG/ML IJ SOLN
INTRAMUSCULAR | Status: AC
  Filled 2012-02-25: qty 1

## 2012-02-25 MED ORDER — MEPERIDINE HCL 100 MG/ML IJ SOLN
INTRAMUSCULAR | Status: DC | PRN
  Administered 2012-02-25: 25 mg via INTRAVENOUS

## 2012-02-25 MED ORDER — STERILE WATER FOR IRRIGATION IR SOLN
Status: DC | PRN
  Administered 2012-02-25: 10:00:00

## 2012-02-25 MED ORDER — POTASSIUM CHLORIDE CRYS ER 20 MEQ PO TBCR
20.0000 meq | EXTENDED_RELEASE_TABLET | Freq: Two times a day (BID) | ORAL | Status: AC
  Administered 2012-02-25 (×2): 20 meq via ORAL
  Filled 2012-02-25 (×2): qty 1

## 2012-02-25 MED ORDER — METOPROLOL SUCCINATE ER 25 MG PO TB24
25.0000 mg | ORAL_TABLET | Freq: Every day | ORAL | Status: DC
  Administered 2012-02-25 – 2012-02-26 (×2): 25 mg via ORAL
  Filled 2012-02-25 (×2): qty 1

## 2012-02-25 MED ORDER — FOLIC ACID 1 MG PO TABS
1.0000 mg | ORAL_TABLET | Freq: Every day | ORAL | Status: DC
  Administered 2012-02-25 – 2012-02-26 (×2): 1 mg via ORAL
  Filled 2012-02-25 (×2): qty 1

## 2012-02-25 NOTE — Progress Notes (Signed)
Dr. Orvan Falconer was notified about pt's H&H result. No new orders received. Pt's vital signs are stable. Pt feels well.,  Continue to monitor closely

## 2012-02-25 NOTE — Care Management Note (Unsigned)
    Page 1 of 1   02/25/2012     2:31:44 PM   CARE MANAGEMENT NOTE 02/25/2012  Patient:  Roberto Nelson, Roberto Nelson   Account Number:  [id number]  Date Initiated:  02/25/2012  Documentation initiated by:  Rosemary Holms  Subjective/Objective Assessment:   Pt lives alone. Currently a friend is staying with him and he does not think he will need HH.     Action/Plan:   Anticipated DC Date:  02/26/2012   Anticipated DC Plan:  HOME/SELF CARE      DC Planning Services  CM consult      Choice offered to / List presented to:             Status of service:  In process, will continue to follow Medicare Important Message given?   (If response is "NO", the following Medicare IM given date fields will be blank) Date Medicare IM given:   Date Additional Medicare IM given:    Discharge Disposition:    Per UR Regulation:    If discussed at Long Length of Stay Meetings, dates discussed:    Comments:  02/25/12 Rosemary Holms RN BSN CM Pt states that he is close to his PCP Dwana Melena and can get assistance if needed.

## 2012-02-25 NOTE — Progress Notes (Signed)
Subjective: Patient was seen prior to the EGD. He stated that he had a restful night. No nausea, vomiting, diarrhea, or abdominal pain. No stool overnight.  Objective: Vital signs in last 24 hours: Filed Vitals:   02/25/12 0940 02/25/12 0945 02/25/12 1017 02/25/12 1122  BP: 128/64  125/58   Pulse: 95 92 94   Temp:      TempSrc:      Resp: 26 26 20    Height:      Weight:      SpO2: 99% 98% 94% 93%    Intake/Output Summary (Last 24 hours) at 02/25/12 1240 Last data filed at 02/25/12 0900  Gross per 24 hour  Intake 1662.5 ml  Output    400 ml  Net 1262.5 ml    Weight change:   Physical exam: General: Pleasant 78 year old Caucasian man, laying in bed, in no acute distress. Lungs: Clear to auscultation bilaterally. Heart: S1, S2, with a soft systolic murmur. Abdomen: Positive bowel sounds, soft, minimal tenderness in the epigastrium, no distention, no guarding. Extremities: No pedal edema. Neurologic: He is alert and oriented x3. Cranial nerves II through XII intact.  Lab Results: Basic Metabolic Panel:  Basename 02/25/12 0504 02/24/12 1500  NA 137 134*  K 3.8 3.8  CL 104 99  CO2 30 30  GLUCOSE 88 117*  BUN 23 30*  CREATININE 1.00 1.14  CALCIUM 8.1* 8.4  MG -- --  PHOS -- --   Liver Function Tests:  Basename 02/25/12 0504 02/24/12 1500  AST 12 13  ALT 7 9  ALKPHOS 50 56  BILITOT 0.9 0.6  PROT 5.0* 5.7*  ALBUMIN 2.1* 2.5*   No results found for this basename: LIPASE:2,AMYLASE:2 in the last 72 hours No results found for this basename: AMMONIA:2 in the last 72 hours CBC:  Basename 02/25/12 0504 02/25/12 0025 02/24/12 1500  WBC 7.8 -- 11.4*  NEUTROABS -- -- 9.2*  HGB 7.2* 7.5* --  HCT 21.1* 21.4* --  MCV 96.3 -- 99.4  PLT 252 -- 312   Cardiac Enzymes: No results found for this basename: CKTOTAL:3,CKMB:3,CKMBINDEX:3,TROPONINI:3 in the last 72 hours BNP: No results found for this basename: PROBNP:3 in the last 72 hours D-Dimer: No results found for  this basename: DDIMER:2 in the last 72 hours CBG: No results found for this basename: GLUCAP:6 in the last 72 hours Hemoglobin A1C: No results found for this basename: HGBA1C in the last 72 hours Fasting Lipid Panel: No results found for this basename: CHOL,HDL,LDLCALC,TRIG,CHOLHDL,LDLDIRECT in the last 72 hours Thyroid Function Tests:  Basename 02/24/12 1500  TSH 2.865  T4TOTAL --  FREET4 --  T3FREE --  THYROIDAB --   Anemia Panel:  Basename 02/24/12 1500  VITAMINB12 --  FOLATE --  FERRITIN 260  TIBC 203*  IRON 38*  RETICCTPCT --   Coagulation:  Basename 02/24/12 1500  LABPROT 13.5  INR 1.04   Urine Drug Screen: Drugs of Abuse  No results found for this basename: labopia, cocainscrnur, labbenz, amphetmu, thcu, labbarb    Alcohol Level: No results found for this basename: ETH:2 in the last 72 hours Urinalysis: No results found for this basename: COLORURINE:2,APPERANCEUR:2,LABSPEC:2,PHURINE:2,GLUCOSEU:2,HGBUR:2,BILIRUBINUR:2,KETONESUR:2,PROTEINUR:2,UROBILINOGEN:2,NITRITE:2,LEUKOCYTESUR:2 in the last 72 hours Misc. Labs:   Micro: No results found for this or any previous visit (from the past 240 hour(s)).  Studies/Results: No results found.  Medications:  Scheduled:   . furosemide  10 mg Intravenous Once  . ipratropium  0.5 mg Nebulization QID  . levalbuterol  0.63 mg Nebulization QID  .  meperidine      . midazolam      . pantoprazole (PROTONIX) IV  40 mg Intravenous BID AC   Continuous:   . 0.9 % NaCl with KCl 20 mEq / L 70 mL/hr at 02/25/12 0900   ZOX:[id number], acetaminophen, albuterol, alum & mag hydroxide-simeth, guaiFENesin-dextromethorphan, morphine injection, ondansetron (ZOFRAN) IV, ondansetron  Assessment: Principal Problem:  *GI bleed Active Problems:  Melena  Anemia due to chronic blood loss  CORONARY ATHEROSCLEROSIS NATIVE CORONARY ARTERY  COPD  Hypotension  S/P right inguinal hernia repair  Dehydration  Antral ulcer   Gastritis and duodenitis    1. GI bleed, presumed to be secondary to large antral ulcer eroding into the pylorus. The patient also has nonerosive gastritis and mild duodenitis of may and, per EGD by Dr. Darrick Penna today. Her recommendations noted and appreciated. We'll continue twice a day PPI and no aspirin or NSAIDs for one month.  Anemia due to to chronic blood loss. His total iron was slightly low his ferritin was within normal limits. Status post 2 units of packed red blood cells. The patient's hemoglobin improved appropriately. However, will transfuse another unit today as his hemoglobin continues to equilibrate.  Dehydration. Resolved with IV fluid hydration.   Hypovolemic hypotension. Resolved with IV fluid hydration.  Status post right inguinal hernia repair. Stable.  COPD. His lungs are clear, status post Xopenex and Atrovent nebulizer.   Plan:  1. Continue gentle IV fluids. 2. Transfuse another unit of packed red blood cells today. 3. Diet advanced per GI. Further recommendations per GI. 4. We'll add folic acid for the next few days.   LOS: 1 day   Roberto Nelson 02/25/2012, 12:40 PM

## 2012-02-25 NOTE — Consult Note (Signed)
REVIEWED.  

## 2012-02-25 NOTE — H&P (Signed)
Primary Care Physician:  Dwana Melena, MD Primary Gastroenterologist:  Dr. Darrick Penna  Pre-Procedure History & Physical: HPI:  Roberto Nelson is a 78 y.o. male here for  MELENA.   Past Medical History  Diagnosis Date  . Edema   . Atherosclerosis   . Tobacco user   . Heart murmur   . Anginal pain   . Hypertension     [place]  [place]   . Shortness of breath     WITH EXERTION   . Cough   . CORONARY ATHEROSCLEROSIS NATIVE CORONARY ARTERY 11/03/2009    Qualifier: Diagnosis of  By: Via LPN, Larita Fife    . COPD 11/03/2009    Qualifier: Diagnosis of  By: Via LPN, Larita Fife    . TOBACCO ABUSE 11/03/2009    Qualifier: Diagnosis of  By: Daleen Squibb, MD, Lorenza Cambridge Incarcerated inguinal hernia, unilateral 02/14/2012    Status post repair, per Dr. Abbey Chatters  . Right bundle branch block and left anterior fascicular block 12/30/2011  . Anemia     Past Surgical History  Procedure Date  . Colonoscopy     [place]OUND [place], NEGATIVE PER PATIENT  . Cardiac catheterization     2005 [place]   . Coronary angioplasty   . Inguinal hernia repair 02/14/2012    Procedure: HERNIA REPAIR INGUINAL ADULT;  Surgeon: Adolph Pollack, MD;  Location: [facility] OR;  Service: General;  Laterality: Right;  . Insertion of mesh 02/14/2012    Procedure: INSERTION OF MESH;  Surgeon: Adolph Pollack, MD;  Location: [facility] OR;  Service: General;  Laterality: Right;    Prior to Admission medications   Medication Sig Start Date End Date Taking? Authorizing Provider  aspirin EC 81 MG tablet Take 81 mg by mouth daily.   Yes Historical Provider, MD  furosemide (LASIX) 40 MG tablet Take 1 tablet (40 mg total) by mouth daily. 12/30/11  Yes Gaylord Shih, MD  lisinopril (PRINIVIL,ZESTRIL) 40 MG tablet Take 1 tablet (40 mg total) by mouth daily. 12/30/11  Yes Gaylord Shih, MD  metoprolol (TOPROL-XL) 50 MG 24 hr tablet Take 50 mg by mouth daily.     Yes Historical Provider, MD  nitroGLYCERIN (NITROSTAT) 0.4 MG SL tablet  Place 1 tablet (0.4 mg total) under the tongue as needed. 12/30/11  Yes Gaylord Shih, MD  ondansetron (ZOFRAN) 4 MG tablet Take 1 tablet (4 mg total) by mouth every 4 (four) hours as needed for nausea. 02/17/12  Yes Adolph Pollack, MD  oxyCODONE-acetaminophen (ROXICET) 5-325 MG per tablet Take 1-2 tablets by mouth every 4 (four) hours as needed for pain (take 1-2 tabs q4hrs as needed for pain). 02/15/12  Yes Blenda Mounts, NP    Allergies as of 02/24/2012 - Review Complete 02/24/2012  Allergen Reaction Noted  . Penicillins Itching 11/03/2009    Family History  Problem Relation Age of Onset  . Colon cancer Neg Hx   . Liver disease Neg Hx     History   Social History  . Marital Status: Widowed    Spouse Name: N/A    Number of Children: 1  . Years of Education: N/A   Occupational History  . Retired    Social History Main Topics  . Smoking status: Current Every Day Smoker -- 0.5 packs/day for 50 years    Types: Cigarettes  . Smokeless tobacco: Not on file  . Alcohol Use: Yes     Comment: BEER/cocktail socially. No heavy  use per patient.   . Drug Use: No  . Sexually Active: Not on file   Other Topics Concern  . Not on file   Social History Narrative   Exercises regularly1 daughter    Review of Systems: See HPI, otherwise negative ROS   Physical Exam: BP 126/66  Pulse 74  Temp 98.8 F (37.1 C) (Oral)  Resp 21  Ht 5\' 7"  (1.702 m)  Wt 164 lb 10.9 oz (74.7 kg)  BMI 25.79 kg/m2  SpO2 93% General:   Alert,  pleasant and cooperative in NAD Head:  Normocephalic and atraumatic. Neck:  Supple; Lungs:  Clear throughout to auscultation.    Heart:  Regular rate and rhythm. Abdomen:  Soft, nontender and nondistended. Normal bowel sounds, without guarding, and without rebound.   Neurologic:  Alert and  oriented x4;  grossly normal neurologically.  Impression/Plan:     MELENA.  PLAN: 1. EGD TODAY

## 2012-02-25 NOTE — Op Note (Signed)
[facility] [address] [place], [address]   ENDOSCOPY PROCEDURE REPORT  PATIENT: Roberto Nelson, Roberto Nelson  MR#: [id number] BIRTHDATE: February 11, 1934 , 78  yrs. old GENDER: Male  ENDOSCOPIST: Jonette Eva, MD REFERRED XB:JYNW Hall, M.D.  PROCEDURE DATE: 02/25/2012 PROCEDURE:   EGD w/ biopsy for H.pylori  INDICATIONS:Melena. MEDICATIONS: Demerol 25 mg IV and Versed 4 mg IV TOPICAL ANESTHETIC:   Cetacaine Spray  DESCRIPTION OF PROCEDURE:     Physical exam was performed.  Informed consent was obtained from the patient after explaining the benefits, risks, and alternatives to the procedure.  The patient was connected to the monitor and placed in the left lateral position.  Continuous oxygen was provided by nasal cannula and IV medicine administered through an indwelling cannula.  After administration of sedation, the patients esophagus was intubated and the EG-[id number] ([id number])  endoscope was advanced under direct visualization to the second portion of the duodenum.  The scope was removed slowly by carefully examining the color, texture, anatomy, and integrity of the mucosa on the way out.  The patient was recovered in endoscopy and discharged home in satisfactory condition.      ESOPHAGUS: The mucosa of the esophagus appeared normal.   A small hiatal hernia was noted.  STOMACH: A large non-bleeding deep, irregular shaped and clean-based ulcer with heaped up edges was found in the gastric antrum and at the pylorus.   Moderate non-erosive gastritis (inflammation) was found in the entire examined stomach.  Multiple biopsies were performed using cold forceps.  DUODENUM: Mild duodenal inflammation was found in the bulb and second portion of the duodenum.  NO OLD BLOOD OR FRESH BLOOD IN STOMACH OR DUODENUM.  COMPLICATIONS:   None  ENDOSCOPIC IMPRESSION: 1.   The mucosa of the esophagus appeared normal 2.   Small hiatal hernia 3.   Large non-bleeding ulcer was  found in the gastric antrum and at the pylorus 4.   Non-erosive gastritis (inflammation) was found in the entire examined stomach; multiple biopsies 5.   MILD Duodenal inflammation was found in the bulb and second portion of the duodenum 6.   UGIB DUE TO LARGE ANTRAL ULCER ERODING INTO THE PYLORUS  RECOMMENDATIONS: BID PPI FOR 3 MOS THEN ONCE DAILY FOREVER NO ASA OR NSAIDS FOR ONE MONTH.  NO ANTICOAGULATION FOR 2 WEEKS. LOW FAT SOFGT MECH DIET REPEAT EGD IN 3 MOS OPV IN 6 MOS   REPEAT EXAM:   _______________________________ Rosalie DoctorJonette Eva, MD 02/25/2012 10:10 AM       PATIENT NAME:  Roberto Nelson MR#: [id number]

## 2012-02-25 NOTE — Consult Note (Signed)
Referring Provider: Elliot Cousin, MD Primary Care Physician:  Dwana Melena, MD Primary Gastroenterologist:  Jonette Eva, MD  Reason for Consultation:  GI bleed, melena  HPI: Roberto Nelson is a 78 y.o. male s/p right inguinal hernia repair for chronic incarceration on 02/14/12 by Dr. Abbey Chatters, CAD s/p stent (remote) maintained on ASA more recently, who presents with c/o black stools since surgery. Patient reports melena started after his surgery. He has also had nausea especially with solid foods since his surgery. Denies problems prior to surgery. No vomiting. BRBPR noted in stools a few months ago. Denies NSAIDS other than ASA, but this was held for more than 2 weeks b/c of hernia repair. Was on Plavix up until 12/2011. Patient reports social etoh use. Denies h/o heavy abuse in present or past. Denies abdominal pain, heartburn, dysphagia. Some associated weight loss since surgery and diminished appetite. Went to Dr. [place] [employer] office yesterday and was driven to ER by Dr. Margo Aye.   In ER, he had BP of 80/42 initially. Stool black and heme positive. Notably his Hgb was over 15 in 01/2012 and 5.7 on presentation to ER.   Prior to Admission medications   Medication Sig Start Date End Date Taking? Authorizing Provider  aspirin EC 81 MG tablet Take 81 mg by mouth daily.   Yes Historical Provider, MD  furosemide (LASIX) 40 MG tablet Take 1 tablet (40 mg total) by mouth daily. 12/30/11  Yes Gaylord Shih, MD  lisinopril (PRINIVIL,ZESTRIL) 40 MG tablet Take 1 tablet (40 mg total) by mouth daily. 12/30/11  Yes Gaylord Shih, MD  metoprolol (TOPROL-XL) 50 MG 24 hr tablet Take 50 mg by mouth daily.     Yes Historical Provider, MD  nitroGLYCERIN (NITROSTAT) 0.4 MG SL tablet Place 1 tablet (0.4 mg total) under the tongue as needed. 12/30/11  Yes Gaylord Shih, MD  ondansetron (ZOFRAN) 4 MG tablet Take 1 tablet (4 mg total) by mouth every 4 (four) hours as needed for nausea. 02/17/12  Yes Adolph Pollack, MD   oxyCODONE-acetaminophen (ROXICET) 5-325 MG per tablet Take 1-2 tablets by mouth every 4 (four) hours as needed for pain (take 1-2 tabs q4hrs as needed for pain). 02/15/12  Yes Blenda Mounts, NP    Current Facility-Administered Medications  Medication Dose Route Frequency Provider Last Rate Last Dose  . 0.9 % NaCl with KCl 20 mEq/ L  infusion   Intravenous Continuous Elliot Cousin, MD 70 mL/hr at 02/24/12 2302    . acetaminophen (TYLENOL) tablet 650 mg  650 mg Oral Q6H PRN Elliot Cousin, MD       Or  . acetaminophen (TYLENOL) suppository 650 mg  650 mg Rectal Q6H PRN Elliot Cousin, MD      . albuterol (PROVENTIL) (5 MG/ML) 0.5% nebulizer solution 2.5 mg  2.5 mg Nebulization Q2H PRN Elliot Cousin, MD      . alum & mag hydroxide-simeth (MAALOX/MYLANTA) 200-200-20 MG/5ML suspension 30 mL  30 mL Oral Q6H PRN Elliot Cousin, MD      . guaiFENesin-dextromethorphan (ROBITUSSIN DM) 100-10 MG/5ML syrup 5 mL  5 mL Oral Q4H PRN Elliot Cousin, MD      . ipratropium (ATROVENT) nebulizer solution 0.5 mg  0.5 mg Nebulization QID Elliot Cousin, MD   0.5 mg at 02/25/12 0713  . levalbuterol (XOPENEX) nebulizer solution 0.63 mg  0.63 mg Nebulization QID Elliot Cousin, MD   0.63 mg at 02/25/12 0713  . morphine 2 MG/ML injection 2 mg  2 mg Intravenous Q2H  PRN Elliot Cousin, MD      . ondansetron [facility]) tablet 4 mg  4 mg Oral Q6H PRN Elliot Cousin, MD       Or  . ondansetron [facility]) injection 4 mg  4 mg Intravenous Q6H PRN Elliot Cousin, MD      . pantoprazole (PROTONIX) injection 40 mg  40 mg Intravenous Q12H Elliot Cousin, MD   40 mg at 02/25/12 0527    Allergies as of 02/24/2012 - Review Complete 02/24/2012  Allergen Reaction Noted  . Penicillins Itching 11/03/2009    Past Medical History  Diagnosis Date  . Edema   . Atherosclerosis   . Tobacco user   . Heart murmur   . Anginal pain   . Hypertension     [place]  [place]   . Shortness of breath     WITH EXERTION   . Cough   . CORONARY  ATHEROSCLEROSIS NATIVE CORONARY ARTERY 11/03/2009    Qualifier: Diagnosis of  By: Via LPN, Larita Fife    . COPD 11/03/2009    Qualifier: Diagnosis of  By: Via LPN, Larita Fife    . TOBACCO ABUSE 11/03/2009    Qualifier: Diagnosis of  By: Daleen Squibb, MD, Lorenza Cambridge Incarcerated inguinal hernia, unilateral 02/14/2012    Status post repair, per Dr. Abbey Chatters  . Right bundle branch block and left anterior fascicular block 12/30/2011    Past Surgical History  Procedure Date  . Colonoscopy     [place]OUND [place], NEGATIVE PER PATIENT  . Cardiac catheterization     2005 [place]   . Coronary angioplasty   . Inguinal hernia repair 02/14/2012    Procedure: HERNIA REPAIR INGUINAL ADULT;  Surgeon: Adolph Pollack, MD;  Location: [facility] OR;  Service: General;  Laterality: Right;  . Insertion of mesh 02/14/2012    Procedure: INSERTION OF MESH;  Surgeon: Adolph Pollack, MD;  Location: [facility] OR;  Service: General;  Laterality: Right;    Family History  Problem Relation Age of Onset  . Colon cancer Neg Hx   . Liver disease Neg Hx     History   Social History  . Marital Status: Widowed    Spouse Name: N/A    Number of Children: 1  . Years of Education: N/A   Occupational History  . Retired    Social History Main Topics  . Smoking status: Current Every Day Smoker -- 0.5 packs/day    Types: Cigarettes  . Smokeless tobacco: Not on file  . Alcohol Use: Yes     Comment: BEER/cocktail socially. No heavy use per patient.   . Drug Use: No  . Sexually Active: Not on file   Other Topics Concern  . Not on file   Social History Narrative   Exercises regularly1 daughter     ROS:  General: Positive for anorexia, weight loss, weakness in last couple of two weeks.  Eyes: Negative for vision changes.  ENT: Negative for hoarseness, difficulty swallowing, nasal congestion. CV: Negative for chest pain, angina, palpitations, dyspnea on exertion, peripheral edema.  Respiratory: Negative for dyspnea at  rest, dyspnea on exertion, cough, sputum, wheezing.  GI: See history of present illness. GU:  Negative for dysuria, hematuria, urinary incontinence, urinary frequency, nocturnal urination.  MS: Negative for joint pain, low back pain.  Derm: Negative for rash or itching.  Neuro: Negative for weakness, abnormal sensation, seizure, frequent headaches, memory loss, confusion.  Psych: Negative for anxiety, depression, suicidal ideation, hallucinations.  Endo: Negative for unusual weight change.  Heme: Negative for bruising or bleeding. Allergy: Negative for rash or hives.       Physical Examination: Vital signs in last 24 hours: Temp:  [97.3 F (36.3 C)-98.8 F (37.1 C)] 98.5 F (36.9 C) (12/13 0800) Pulse Rate:  [41-102] 77  (12/13 0800) Resp:  [19-34] 23  (12/13 0800) BP: (88-139)/(42-93) 121/68 mmHg (12/13 0800) SpO2:  [83 %-100 %] 92 % (12/13 0800) Weight:  [154 lb 5.2 oz (70 kg)-168 lb (76.204 kg)] 164 lb 10.9 oz (74.7 kg) (12/13 0500) Last BM Date: 02/24/12  General: Well-nourished, well-developed in no acute distress. Very talkative. Head: Normocephalic, atraumatic.   Eyes: Conjunctiva pale, no icterus. Mouth: Oropharyngeal mucosa moist and pink , no lesions erythema or exudate. Neck: Supple without thyromegaly, masses, or lymphadenopathy.  Lungs: Clear to auscultation bilaterally.  Heart: Regular rate and rhythm, no murmurs rubs or gallops.  Abdomen: Bowel sounds are normal, nontender, nondistended, no hepatosplenomegaly or masses, no abdominal bruits or hernia, no rebound or guarding.  Right inguinal incision without drainage, swelling, bruising. Rectal: not performed Extremities: No lower extremity edema, clubbing, deformity.  Neuro: Alert and oriented x 4 , grossly normal neurologically.  Skin: Warm and dry, no rash or jaundice.   Psych: Alert and cooperative, normal mood and affect.        Intake/Output from previous day: 12/12 0701 - 12/13 0700 In: 1382.5 [I.V.:670;  Blood:712.5] Out: 400 [Urine:400] Intake/Output this shift:    Lab Results: CBC  Basename 02/25/12 0504 02/25/12 0025 02/24/12 1500  WBC 7.8 -- 11.4*  HGB 7.2* 7.5* 5.7*  HCT 21.1* 21.4* 17.0*  MCV 96.3 -- 99.4  PLT 252 -- 312   Hemoglobin 15.2 on 02/03/12.   Lab Results  Component Value Date   IRON 38* 02/24/2012   TIBC 203* 02/24/2012   FERRITIN 260 02/24/2012    BMET  Basename 02/25/12 0504 02/24/12 1500  NA 137 134*  K 3.8 3.8  CL 104 99  CO2 30 30  GLUCOSE 88 117*  BUN 23 30*  CREATININE 1.00 1.14  CALCIUM 8.1* 8.4   LFT  Basename 02/25/12 0504 02/24/12 1500  BILITOT 0.9 0.6  BILIDIR -- --  IBILI -- --  ALKPHOS 50 56  AST 12 13  ALT 7 9  PROT 5.0* 5.7*  ALBUMIN 2.1* 2.5*   No results found for this basename: LIPASE    PT/INR  Basename 02/24/12 1500  LABPROT 13.5  INR 1.04      Impression: 78 y/o WM, s/p right inguinal hernia repair on 02/14/12 (estimated blood loss of <100cc per op note), CAD s/p remote stent recently transitioned to ASA from Plavix in 12/2011, who presents with couple week h/o intermittent melena, worse the last couple of days. Associated nausea. Profound anemia on presentation with hypotension. Hemodynamically stable at this point. UGI bleed, ?etiology. Ddx includes PUD, AVMs, variceal bleed (although objectively no signs of cirrhosis except for low albumin), less likely malignancy.   Plan: 1. EGD this AM. 2. Keep two units of prbcs available. Consider transfusing one more unit at this time.  3. PPI IV. 4. Further recommendations to follow.   I would like to thank Dr. Sherrie Mustache for allowing [place] to take part in the care of this nice patient.    LOS: 1 day   Tana Coast  02/25/2012, 8:29 AM

## 2012-02-25 NOTE — Progress Notes (Signed)
UR Chart Review Completed  

## 2012-02-26 ENCOUNTER — Inpatient Hospital Stay (HOSPITAL_COMMUNITY): Payer: Medicare Other

## 2012-02-26 DIAGNOSIS — N390 Urinary tract infection, site not specified: Secondary | ICD-10-CM

## 2012-02-26 DIAGNOSIS — K922 Gastrointestinal hemorrhage, unspecified: Secondary | ICD-10-CM

## 2012-02-26 LAB — URINALYSIS, ROUTINE W REFLEX MICROSCOPIC
Nitrite: NEGATIVE
Specific Gravity, Urine: >1.03 — ABNORMAL HIGH (ref 1.005–1.030)
Urobilinogen, UA: 0.2 mg/dL (ref 0.0–1.0)
pH: 5.5 (ref 5.0–8.0)

## 2012-02-26 LAB — CBC
HCT: 24.1 % — ABNORMAL LOW (ref 39.0–52.0)
MCV: 96.8 fL (ref 78.0–100.0)
Platelets: 258 10*3/uL (ref 150–400)
RBC: 2.49 MIL/uL — ABNORMAL LOW (ref 4.22–5.81)
RDW: 15.9 % — ABNORMAL HIGH (ref 11.5–15.5)
WBC: 12 10*3/uL — ABNORMAL HIGH (ref 4.0–10.5)

## 2012-02-26 LAB — URINE MICROSCOPIC-ADD ON

## 2012-02-26 MED ORDER — LISINOPRIL 40 MG PO TABS: 20.0000 mg | ORAL_TABLET | Freq: Every day | ORAL | Status: DC

## 2012-02-26 MED ORDER — CIPROFLOXACIN IN D5W 200 MG/100ML IV SOLN
200.0000 mg | Freq: Once | INTRAVENOUS | Status: AC
  Administered 2012-02-26: 200 mg via INTRAVENOUS
  Filled 2012-02-26: qty 100

## 2012-02-26 MED ORDER — TAB-A-VITE/IRON PO TABS: 1.0000 | ORAL_TABLET | Freq: Every day | ORAL | Status: DC

## 2012-02-26 MED ORDER — PANTOPRAZOLE SODIUM 40 MG PO TBEC: 40.0000 mg | DELAYED_RELEASE_TABLET | Freq: Two times a day (BID) | ORAL | Status: AC

## 2012-02-26 MED ORDER — CIPROFLOXACIN HCL 500 MG PO TABS: 500.0000 mg | ORAL_TABLET | Freq: Two times a day (BID) | ORAL | Status: DC

## 2012-02-26 MED ORDER — ALBUTEROL SULFATE HFA 108 (90 BASE) MCG/ACT IN AERS: 2.0000 | INHALATION_SPRAY | Freq: Four times a day (QID) | RESPIRATORY_TRACT | Status: DC | PRN

## 2012-02-26 MED ORDER — FUROSEMIDE 20 MG PO TABS
20.0000 mg | ORAL_TABLET | Freq: Every day | ORAL | Status: DC
  Administered 2012-02-26: 20 mg via ORAL
  Filled 2012-02-26: qty 1

## 2012-02-26 MED ORDER — FUROSEMIDE 40 MG PO TABS: 20.0000 mg | ORAL_TABLET | Freq: Every day | ORAL | Status: DC

## 2012-02-26 NOTE — Discharge Summary (Signed)
Physician Discharge Summary  Patient ID: EH SESAY MRN: [id number] DOB/AGE: 1933/11/09 78 y.o.  Admit date: 02/14/2012 Discharge date: 02/15/2012  Admission Diagnoses:  Chronically incarcerated large right inguinal hernia  Discharge Diagnoses:  Chronically incarcerated right inguinal hernia   Discharged Condition: good  Hospital Course: He underwent right inguinal hernia repair with mesh and tolerated it well.  He was ambulating, tolerating his diet and voiding well on POD#1.  He was able to be discharged.  Discharge instructions were given.  Consults: None  Significant Diagnostic Studies: none  Treatments: surgery: Repair of large chronically incarcerated right inguinal hernia with mesh.  Discharge Exam: Blood pressure 147/64, pulse 62, temperature 97.4 F (36.3 C), temperature source Oral, resp. rate 16, height 5\' 7"  (1.702 m), weight 168 lb (76.204 kg), SpO2 96.00%.   Disposition: 01-Home or Self Care  Discharge Orders    Future Appointments: Provider: Department: Dept Phone: Center:   02/29/2012 10:50 AM Adolph Pollack, MD [facility], [place] [phone] None       Medication List     As of 02/26/2012  3:15 PM    STOP taking these medications         aspirin EC 81 MG tablet      furosemide 40 MG tablet   Commonly known as: LASIX      lisinopril 40 MG tablet   Commonly known as: PRINIVIL,ZESTRIL      TAKE these medications         metoprolol succinate 50 MG 24 hr tablet   Commonly known as: TOPROL-XL   Take 50 mg by mouth daily.      nitroGLYCERIN 0.4 MG SL tablet   Commonly known as: NITROSTAT   Place 1 tablet (0.4 mg total) under the tongue as needed.      oxyCODONE-acetaminophen 5-325 MG per tablet   Commonly known as: PERCOCET/ROXICET   Take 1-2 tablets by mouth every 4 (four) hours as needed for pain (take 1-2 tabs q4hrs as needed for pain).         Signed: Adolph Pollack 02/26/2012, 3:15 PM

## 2012-02-26 NOTE — Progress Notes (Signed)
Out via wheelchair to truck for transfer home.  No acute distress noted.

## 2012-02-26 NOTE — Discharge Summary (Signed)
Physician Discharge Summary  Roberto Nelson ZOX:[id number] DOB: 03-03-1934 DOA: 02/24/2012  PCP: Dwana Melena, MD  Admit date: 02/24/2012 Discharge date: 02/26/2012  Time spent: Greater than 30 minutes  Recommendations for Outpatient Follow-up:  1. Gastric biopsy results per Dr. Darrick Penna were pending at the time of discharge. The patient was instructed to call Dr. Darrick Penna' office if he is not notified of the results by the middle to the end of next week. 2. He was advised to restart aspirin in 4 weeks , but no other NSAIDs. 3. The dosing of furosemide and lisinopril was decreased to half due to his low normal blood pressures.  Discharge Diagnoses:  1. Upper GI bleed secondary to antral ulcer eroding into the pylorus. 2. Small hiatal hernia, large nonbleeding ulcer found in the antrum, gastritis, and mild white night his per EGD by Dr. Darrick Penna on 02/25/2012. Biopsies taken. 3. Anemia secondary to chronic blood loss. Status post 3 units of packed red blood cell transfusions. The patient's hemoglobin was 5.7 on admission and 8.1 at the time of discharge. 4. Dehydration. 5. Hypotension secondary to hypovolemia and chronic GI blood losses. 6. Status post right inguinal hernia repair on 02/14/2012. 7. Coronary artery disease. Stable. 8. Hypertension. Low-normal blood pressures during the hospitalization. 9. COPD. 10. Tobacco abuse. The patient has almost stopped. He was advised to continue tobacco cessation. 11. Urinary tract infection.   Discharge Condition: Improved.  Diet recommendation: Heart healthy.  Filed Weights   02/24/12 1443 02/24/12 1717 02/25/12 0500  Weight: 76.204 kg (168 lb) 70 kg (154 lb 5.2 oz) 74.7 kg (164 lb 10.9 oz)    History of present illness:  The patient is a 78 year old man with a history significant for coronary artery disease and recent right incarcerated inguinal hernia, status post hernia repair by Dr. Abbey Chatters 02/14/2012, who presented to the emergency  department on 02/24/2012 for a chief complaint of black tarry stools. In the emergency department, he was initially hypotensive with a blood pressure of 80/42. Following IV fluids, his blood pressure improved to 139/71. His lab data were significant for WBC of 11.4, hemoglobin of 5.7, hematocrit of 17.0, sodium of 134, and BUN of 30. His stool was black and guaiac positive per ED physician. He was admitted for further evaluation and management.  Hospital Course:  The patient was admitted to the step down unit. Following the bolus of normal saline in the emergency department, he was continued on IV fluid hydration with normal saline. 2 units of packed red blood cells were transfused during the first 12 hours of the hospitalization. His hemoglobin and hematocrit were followed closely. Ferritin and total iron were ordered prior to the transfusions. IV Protonix was started every 12 hours. His antihypertensive medications were withheld overnight. Bronchodilator therapy with Xopenex and Atrovent were given for very mild bronchospasms. He has virtually stopped smoking. He was advised to continue complete tobacco cessation. Gastroenterology was consulted. Dr. Darrick Penna provided the initial consultation. Following her evaluation, she proceeded with an EGD. The EGD results were dictated above. Multiple biopsies were taken. The results were pending at the time of discharge.  Dr. Darrick Penna recommended that proton pump inhibitor therapy be continued twice daily for 3 months and then daily thereafter. She recommended no aspirin or NSAIDs for one month and no anticoagulation if needed for 2 weeks. The patient would need another followup EGD in 3 months.  The patient improved clinically and symptomatically. His diet was advanced which he tolerated well. His blood pressures remain on  the low-normal side, and therefore, she was instructed to decrease Lasix and lisinopril to half of his usual dosing. Metoprolol dosing would remain  the same. His hemoglobin improved to 8.1 following 3 units of packed red blood cells. His BUN normalized. His serum sodium normalized. His total iron was 38, slightly low and his ferritin was within normal limits at 260. His TSH was within normal limits at 2.8.  The next day, following EGD, the patient's white blood cell count increased. Therefore, a chest x-ray and urinalysis was ordered. The chest x-ray result did not show pneumonia. His urinalysis did reveal 7-10 WBCs. Therefore, he was started on Cipro. He was discharged on Cipro for one more week. He remained afebrile during the hospitalization.  Procedures:  EGD, per Dr. Darrick Penna.  Consultations:  Gastroenterologist, Dr. Darrick Penna and Dr. Jena Gauss  Discharge Exam: Filed Vitals:   02/26/12 0848 02/26/12 0944 02/26/12 1150 02/26/12 1200  BP:  120/82    Pulse:  93    Temp:    98.5 F (36.9 C)  TempSrc:    Oral  Resp:      Height:      Weight:      SpO2: 95%  92%     General: alert 78 year old Caucasian man sitting up in bed, in no acute distress. Cardiovascular: S1, S2, with a soft systolic murmur. Respiratory: occasional wheeze, cleared with coughing. Breathing nonlabored. Abdomen: Positive bowel sounds, soft, nontender, nondistended. X-rays: No pedal edema.  Discharge Instructions  Discharge Orders    Future Appointments: Provider: Department: Dept Phone: Center:   02/29/2012 10:50 AM Adolph Pollack, MD [facility], [place] [phone] None     Future Orders Please Complete By Expires   Diet - low sodium heart healthy      Increase activity slowly      Discharge instructions      Comments:   Do not take aspirin yet. It can be restarted in 4 weeks. The dose of lisinopril and furosemide were decreased to half of your usual dose because of your low-normal blood pressures.       Medication List     As of 02/26/2012  2:25 PM    STOP taking these medications         aspirin EC 81 MG tablet      TAKE these  medications         albuterol 108 (90 BASE) MCG/ACT inhaler   Commonly known as: PROVENTIL HFA;VENTOLIN HFA   Inhale 2 puffs into the lungs every 6 (six) hours as needed for wheezing (Or chest congestion.).      ciprofloxacin 500 MG tablet   Commonly known as: CIPRO   Take 1 tablet (500 mg total) by mouth 2 (two) times daily. Antibiotic to take for 7 more days.      furosemide 40 MG tablet   Commonly known as: LASIX   Take 0.5 tablets (20 mg total) by mouth daily. Dose changed to half a tablet daily.      lisinopril 40 MG tablet   Commonly known as: PRINIVIL,ZESTRIL   Take 0.5 tablets (20 mg total) by mouth daily. Dose changed to half a tablet daily.      metoprolol succinate 50 MG 24 hr tablet   Commonly known as: TOPROL-XL   Take 50 mg by mouth daily.      multivitamins with iron Tabs   Take 1 tablet by mouth daily.      nitroGLYCERIN 0.4 MG SL tablet  Commonly known as: NITROSTAT   Place 1 tablet (0.4 mg total) under the tongue as needed.      ondansetron 4 MG tablet   Commonly known as: ZOFRAN   Take 1 tablet (4 mg total) by mouth every 4 (four) hours as needed for nausea.      oxyCODONE-acetaminophen 5-325 MG per tablet   Commonly known as: PERCOCET/ROXICET   Take 1-2 tablets by mouth every 4 (four) hours as needed for pain (take 1-2 tabs q4hrs as needed for pain).      pantoprazole 40 MG tablet   Commonly known as: PROTONIX   Take 1 tablet (40 mg total) by mouth 2 (two) times daily before a meal. For treatment of ulcer.           Follow-up Information    Follow up with Jonette Eva, MD. Schedule an appointment as soon as possible for a visit in 2 months. (Call Dr. Darrick Penna' office next week for the results of your biopsies.)    Contact information:   [address] [place] [address] [phone]           The results of significant diagnostics from this hospitalization (including imaging, microbiology, ancillary and  laboratory) are listed below for reference.    Significant Diagnostic Studies: Dg Chest 2 View  02/26/2012  *RADIOLOGY REPORT*  Clinical Data: Cough  CHEST - 2 VIEW  Comparison: 02/15/2012  Findings: Patchy left lower lobe opacity, likely reflecting a combination of atelectasis and small left pleural effusion.  Chronic interstitial markings.  No pneumothorax.  Cardiomediastinal silhouette is within normal limits.  Degenerative changes of the visualized thoracolumbar spine.  IMPRESSION: Small left pleural effusion.  Associated patchy left lower lobe opacity, likely atelectasis.   Original Report Authenticated By: Charline Bills, M.D.    Dg Chest 2 View  02/15/2012  *RADIOLOGY REPORT*  Clinical Data: Follow-up densities thought to reflect nipple shadows  CHEST - 2 VIEW  Comparison: 02/03/2012  Findings: The nipple markers correspond to the nodular opacities on the prior study.  These are nipple shadows.  The lungs are clear. Relative lucency in the upper lobes is consistent with emphysema, left greater right.  The heart is normal in size.  The aorta is uncoiled.  No mediastinal or hilar mass or adenopathy.  IMPRESSION: No acute cardiopulmonary disease.  Nodular shadows seen on the prior chest radiograph are nipple shadows and need no additional evaluation.   Original Report Authenticated By: Amie Portland, M.D.    Dg Chest 2 View  02/03/2012  *RADIOLOGY REPORT*  Clinical Data: Preop for hernia repair  CHEST - 2 VIEW  Comparison: None.  Findings: The lungs are very hyperaerated consistent with severe COPD.  No infiltrate or effusion is seen.  Nodular opacities at both lung bases are most consistent with nipple shadows but repeat film with nipple markers could be obtained to confirm.  The heart is mildly enlarged.  There are degenerative changes throughout the thoracic spine  IMPRESSION:  1.  Severe COPD. 2.  No definite active process. 3.  Probable nipple shadows at the lung bases.  Consider repeat film  with nipple markers.   Original Report Authenticated By: Dwyane Dee, M.D.     Microbiology: No results found for this or any previous visit (from the past 240 hour(s)).   Labs: Basic Metabolic Panel:  Lab 02/25/12 0865 02/24/12 1500  NA 137 134*  K 3.8 3.8  CL 104 99  CO2 30  30  GLUCOSE 88 117*  BUN 23 30*  CREATININE 1.00 1.14  CALCIUM 8.1* 8.4  MG -- --  PHOS -- --   Liver Function Tests:  Lab 02/25/12 0504 02/24/12 1500  AST 12 13  ALT 7 9  ALKPHOS 50 56  BILITOT 0.9 0.6  PROT 5.0* 5.7*  ALBUMIN 2.1* 2.5*   No results found for this basename: LIPASE:5,AMYLASE:5 in the last 168 hours No results found for this basename: AMMONIA:5 in the last 168 hours CBC:  Lab 02/26/12 0429 02/25/12 0504 02/25/12 0025 02/24/12 1500  WBC 12.0* 7.8 -- 11.4*  NEUTROABS -- -- -- 9.2*  HGB 8.1* 7.2* 7.5* 5.7*  HCT 24.1* 21.1* 21.4* 17.0*  MCV 96.8 96.3 -- 99.4  PLT 258 252 -- 312   Cardiac Enzymes: No results found for this basename: CKTOTAL:5,CKMB:5,CKMBINDEX:5,TROPONINI:5 in the last 168 hours BNP: BNP (last 3 results) No results found for this basename: PROBNP:3 in the last 8760 hours CBG: No results found for this basename: GLUCAP:5 in the last 168 hours     Signed:  Thania Woodlief  [facility] 02/26/2012, 2:25 PM

## 2012-02-26 NOTE — Progress Notes (Signed)
Subjective: Tolerating diet. No hematemesis or melena overnight. Actually seen with Dr. Sherrie Mustache.  Objective: Vital signs in last 24 hours: Temp:  [97.9 F (36.6 C)-98.8 F (37.1 C)] 98.3 F (36.8 C) (12/14 0400) Pulse Rate:  [29-110] 89  (12/13 2200) Resp:  [17-30] 30  (12/14 0600) BP: (77-154)/(40-125) 115/65 mmHg (12/14 0400) SpO2:  [71 %-100 %] 100 % (12/13 2200) Last BM Date: 02/24/12 General:   Alert,  elderly gentleman who appears comfortable and in no acute distress. Abdomen:  Soft, nontender and nondistended.   without guarding, and without rebound.  No mass or organomegaly. Extremities:  Without clubbing or edema.    Intake/Output from previous day: 12/13 0701 - 12/14 0700 In: 3966.2 [P.O.:1920; I.V.:1756; Blood:279.2; IV Piggyback:11] Out: 2325 [Urine:2325] Intake/Output this shift:    Lab Results:  Basename 02/26/12 0429 02/25/12 0504 02/25/12 0025 02/24/12 1500  WBC 12.0* 7.8 -- 11.4*  HGB 8.1* 7.2* 7.5* --  HCT 24.1* 21.1* 21.4* --  PLT 258 252 -- 312   BMET  Basename 02/25/12 0504 02/24/12 1500  NA 137 134*  K 3.8 3.8  CL 104 99  CO2 30 30  GLUCOSE 88 117*  BUN 23 30*  CREATININE 1.00 1.14  CALCIUM 8.1* 8.4   LFT  Basename 02/25/12 0504  PROT 5.0*  ALBUMIN 2.1*  AST 12  ALT 7  ALKPHOS 50  BILITOT 0.9  BILIDIR --  IBILI --   PT/INR  Basename 02/24/12 1500  LABPROT 13.5  INR 1.04   Assessment: Recent upper GI bleed secondary to peptic ulcer disease. Biopsies pending to check for H. pylori he has done well since hospitalized. H&H is appropriate. He had a clean-based ulcer.  Recommendations: As per Dr. Darrick Penna procedure note. Could go home later today per the hospitalist service on twice a day proton pump inhibitor therapy. Avoid aspirin and NSAIDs as outlined. Dr. Darrick Penna will followup on biopsies and arrange followup in the office in the coming weeks. You will need a repeat EGD in 3 months.

## 2012-02-26 NOTE — Progress Notes (Signed)
Discharge instructions given to patient. Verbalized understanding of discharge instructions  Patient preparing to go home.  When he gets ready.  No acute distress note.

## 2012-02-28 LAB — TYPE AND SCREEN
ABO/RH(D): A POS
Unit division: 0

## 2012-02-29 ENCOUNTER — Encounter (INDEPENDENT_AMBULATORY_CARE_PROVIDER_SITE_OTHER): Payer: Medicare Other | Admitting: General Surgery

## 2012-02-29 ENCOUNTER — Encounter (HOSPITAL_COMMUNITY): Payer: Self-pay | Admitting: Gastroenterology

## 2012-03-01 ENCOUNTER — Telehealth: Payer: Self-pay | Admitting: Gastroenterology

## 2012-03-01 NOTE — Telephone Encounter (Signed)
Path faxed to PCP, recalls made 

## 2012-03-01 NOTE — Telephone Encounter (Signed)
PLEASE CALL PT.  He has H. Pylori gastritis. HE has an allergy to PCN and so HE needs PYLERA 3 PILLS QID FOR 10 DAYS, #QS, RFX0. She should take PROTONIX BID for 3 MOS then once daily. The meds can cause nausea, vomiting, abd cramps, loose stools, black colored stools, and metallic taste in HIS mouth. REPEAT EGD IN 3 MOS. OPV IN 6 MOS E30 H PYLORI PUD.

## 2012-03-01 NOTE — Telephone Encounter (Signed)
LMOM to call.

## 2012-03-06 NOTE — Telephone Encounter (Signed)
LMOM to call. Will send letter for pt to call also.

## 2012-03-06 NOTE — Telephone Encounter (Signed)
Pt called back and was informed. I tried to call [facility], but cannot get through. Will try later. Mailing the vouchers for Pylera to pt per his request. He is at [facility] now.

## 2012-03-06 NOTE — Telephone Encounter (Signed)
Called Rx to Rhine at [facility]. She said she will have to take a look at the vouchers to find out if he can use them If he has Medicare Part D, she said he will not be able to use the vouchers. She said pt has Protonix .

## 2012-03-07 NOTE — Telephone Encounter (Signed)
Called and informed Roberto Nelson the pharmacist per Hendricks Limes, that the vouchers are to cover as samples, and nothing will be charged to insurance. Call if they have questions.

## 2012-03-23 ENCOUNTER — Telehealth: Payer: Self-pay | Admitting: Cardiology

## 2012-03-23 NOTE — Telephone Encounter (Signed)
Patient wants to know if he can be taken off of Lasix due to frequent urination.  States that he doesn't think it is good on his kidneys. / tgs

## 2012-03-23 NOTE — Telephone Encounter (Signed)
Attempted to contact patient.  Message left for a return call.  Kidney function has been normal, with no immediate indication of renal insufficiency.

## 2012-03-24 NOTE — Telephone Encounter (Signed)
Spoke with patient and states that he will try taking lasix every other day, as suggested by PCP, continuing to keep track of swelling and weight, reporting any gain.

## 2012-03-28 ENCOUNTER — Encounter (INDEPENDENT_AMBULATORY_CARE_PROVIDER_SITE_OTHER): Payer: Medicare Other | Admitting: General Surgery

## 2012-05-18 ENCOUNTER — Encounter: Payer: Self-pay | Admitting: *Deleted

## 2012-06-13 ENCOUNTER — Ambulatory Visit: Payer: Medicare Other | Admitting: Gastroenterology

## 2012-06-22 ENCOUNTER — Encounter: Payer: Self-pay | Admitting: Gastroenterology

## 2012-06-27 ENCOUNTER — Encounter: Payer: Self-pay | Admitting: Gastroenterology

## 2012-06-27 ENCOUNTER — Ambulatory Visit (INDEPENDENT_AMBULATORY_CARE_PROVIDER_SITE_OTHER): Payer: Medicare Other | Admitting: Gastroenterology

## 2012-06-27 ENCOUNTER — Other Ambulatory Visit: Payer: Self-pay | Admitting: Gastroenterology

## 2012-06-27 VITALS — BP 144/80 | HR 40 | Temp 97.9°F | Ht 66.0 in | Wt 160.4 lb

## 2012-06-27 DIAGNOSIS — D649 Anemia, unspecified: Secondary | ICD-10-CM

## 2012-06-27 DIAGNOSIS — Z862 Personal history of diseases of the blood and blood-forming organs and certain disorders involving the immune mechanism: Secondary | ICD-10-CM

## 2012-06-27 DIAGNOSIS — K279 Peptic ulcer, site unspecified, unspecified as acute or chronic, without hemorrhage or perforation: Secondary | ICD-10-CM

## 2012-06-27 DIAGNOSIS — K259 Gastric ulcer, unspecified as acute or chronic, without hemorrhage or perforation: Secondary | ICD-10-CM

## 2012-06-27 MED ORDER — PEG 3350-KCL-NA BICARB-NACL 420 G PO SOLR: 4000.0000 mL | ORAL | Status: DC

## 2012-06-27 NOTE — Patient Instructions (Addendum)
We have scheduled you for an upper endoscopy with Dr. Darrick Penna. Please see separate instructions. We will get a copy of your medication list from [facility] and update your medical record. Please be aware that the current medication list may be inaccurate.

## 2012-06-27 NOTE — Progress Notes (Signed)
Primary Care Physician:  Dwana Melena, MD  Primary Gastroenterologist:  Jonette Eva, MD   Chief Complaint  Patient presents with  . Colonoscopy    HPI:  Roberto Nelson is a 79 y.o. male here for f/u. He is due for f/u EGD to verify antral ulcer healing. Reason for appointment was listed as to schedule colonoscopy but appears patient is up to date. Last colonoscopy per his report was in 2008 and negative (done in [place]). Patient denies h/o colon polyps or FH or CRC. Stools are dark on iron. No brbpr. No abdominal pain, vomiting, constipation, diarrhea. No NSAIDs/ASA use. Taking pantoprazole bid according to pharmacy records. He is due for labs in a couple of weeks with Dr. Dwana Melena.   Current Outpatient Prescriptions  Medication Sig Dispense Refill  . albuterol (PROVENTIL HFA;VENTOLIN HFA) 108 (90 BASE) MCG/ACT inhaler Inhale 2 puffs into the lungs every 6 (six) hours as needed for wheezing (Or chest congestion.).  1 Inhaler  2  . ferrous sulfate 325 (65 FE) MG tablet Take 325 mg by mouth daily with breakfast.      . furosemide (LASIX) 40 MG tablet Take 0.5 tablets (20 mg total) by mouth daily. Dose changed to half a tablet daily.      Marland Kitchen lisinopril (PRINIVIL,ZESTRIL) 20 MG tablet Take 20 mg by mouth daily.      . metoprolol (TOPROL-XL) 50 MG 24 hr tablet Take 50 mg by mouth daily.        . Multiple Vitamins-Iron (MULTIVITAMINS WITH IRON) TABS Take 1 tablet by mouth daily.    0  . nitroGLYCERIN (NITROSTAT) 0.4 MG SL tablet Place 1 tablet (0.4 mg total) under the tongue as needed.  100 tablet  3  . pantoprazole (PROTONIX) 40 MG tablet Take 1 tablet (40 mg total) by mouth 2 (two) times daily before a meal. For treatment of ulcer.  60 tablet  3   No current facility-administered medications for this visit.    Allergies as of 06/27/2012 - Review Complete 06/27/2012  Allergen Reaction Noted  . Penicillins Itching 11/03/2009    Past Medical History  Diagnosis Date  . Edema   .  Atherosclerosis   . Tobacco user   . Heart murmur   . Anginal pain   . Hypertension     [place]     . Shortness of breath     WITH EXERTION   . Cough   . CORONARY ATHEROSCLEROSIS NATIVE CORONARY ARTERY 11/03/2009    Qualifier: Diagnosis of  By: Via LPN, Larita Fife    . COPD 11/03/2009    Qualifier: Diagnosis of  By: Via LPN, Larita Fife    . TOBACCO ABUSE 11/03/2009    Qualifier: Diagnosis of  By: Daleen Squibb, MD, Lorenza [place] Incarcerated inguinal hernia, unilateral 02/14/2012    Status post repair, per Dr. Abbey Chatters  . Right bundle branch block and left anterior fascicular block 12/30/2011  . Anemia   . Antral ulcer 02/25/2012  . Gastritis and duodenitis 02/25/2012    H.Pylori s/p Pylera    Past Surgical History  Procedure Laterality Date  . Colonoscopy      IN [place] AROUND 2008, NEGATIVE PER PATIENT  . Cardiac catheterization      2005 IN [place]   . Coronary angioplasty    . Inguinal hernia repair  02/14/2012    Procedure: HERNIA REPAIR INGUINAL ADULT;  Surgeon: Adolph Pollack, MD;  Location: [facility] OR;  Service: General;  Laterality: Right;  .  Insertion of mesh  02/14/2012    Procedure: INSERTION OF MESH;  Surgeon: Adolph Pollack, MD;  Location: [facility] OR;  Service: General;  Laterality: Right;  . Esophagogastroduodenoscopy  02/25/2012    ZOX:WRUEA hiatal hernia/Large non-bleeding ulcer was found in the gastric antrum/MILD Duodenal inflammation. H.Pylori gastritis on bx    Family History  Problem Relation Age of Onset  . Colon cancer Neg Hx   . Liver disease Neg Hx     History   Social History  . Marital Status: Widowed    Spouse Name: N/A    Number of Children: 1  . Years of Education: N/A   Occupational History  . Retired    Social History Main Topics  . Smoking status: Current Every Day Smoker -- 0.50 packs/day for 50 years    Types: Cigarettes  . Smokeless tobacco: Not on file  . Alcohol Use: Yes     Comment: BEER/cocktail socially. No heavy use per  patient.   . Drug Use: No  . Sexually Active: Not on file   Other Topics Concern  . Not on file   Social History Narrative   Exercises regularly   1 daughter      ROS:  General: Negative for anorexia, weight loss, fever, chills Some fatigue, weakness. Eyes: Negative for vision changes.  ENT: Negative for hoarseness, difficulty swallowing , nasal congestion. CV: Negative for chest pain, angina, palpitations, dyspnea on exertion, peripheral edema.  Respiratory: Negative for dyspnea at rest, dyspnea on exertion, cough, sputum, wheezing.  GI: See history of present illness. GU:  Negative for dysuria, hematuria, urinary incontinence, urinary frequency, nocturnal urination.  MS: Negative for joint pain, low back pain.  Derm: Negative for rash or itching.  Neuro: Negative for weakness, abnormal sensation, seizure, frequent headaches, memory loss, confusion.  Psych: Negative for anxiety, depression, suicidal ideation, hallucinations.  Endo: Negative for unusual weight change.  Heme: Negative for bruising or bleeding. Allergy: Negative for rash or hives.    Physical Examination:  BP 144/80  Pulse 40  Temp(Src) 97.9 F (36.6 C) (Oral)  Ht 5\' 6"  (1.676 m)  Wt 160 lb 6.4 oz (72.757 kg)  BMI 25.9 kg/m2   General: Well-nourished, well-developed in no acute distress.  Head: Normocephalic, atraumatic.   Eyes: Conjunctiva pink, no icterus. Mouth: Oropharyngeal mucosa moist and pink , no lesions erythema or exudate. Neck: Supple without thyromegaly, masses, or lymphadenopathy.  Lungs: Clear to auscultation bilaterally.  Heart: Regular rate and rhythm, no murmurs rubs or gallops.  Abdomen: Bowel sounds are normal, nontender, nondistended, no hepatosplenomegaly or masses, no abdominal bruits or    hernia , no rebound or guarding.   Rectal: not performed Extremities: No lower extremity edema. No clubbing or deformities.  Neuro: Alert and oriented x 4 , grossly normal neurologically.   Skin: Warm and dry, no rash or jaundice.   Psych: Alert and cooperative, normal mood and affect.

## 2012-06-27 NOTE — Progress Notes (Signed)
CC PCP 

## 2012-06-27 NOTE — Assessment & Plan Note (Signed)
79 y/o male with h/o GI bleed secondary to large antral ulcer in setting of Plavix/ASA and H.Pylori. He reports that he complete Pylera. Is still on pantoprazole but unclear if taking once or twice daily. Without GI symptoms. We will request copy of last H/H from Dr. Dwana Melena for review. He is due for f/u EGD to verify ulcer healing.  I have discussed the risks, alternatives, benefits with regards to but not limited to the risk of reaction to medication, bleeding, infection, perforation and the patient is agreeable to proceed. Written consent to be obtained.  At this time I do not see an indication for colonoscopy; however, if his H/H are still low, I would consider colonoscopy for further evaluation. Patient is agreeable.

## 2012-07-07 ENCOUNTER — Encounter (HOSPITAL_COMMUNITY): Payer: Self-pay | Admitting: Pharmacy Technician

## 2012-07-11 ENCOUNTER — Other Ambulatory Visit: Payer: Self-pay

## 2012-07-11 DIAGNOSIS — D649 Anemia, unspecified: Secondary | ICD-10-CM

## 2012-07-11 NOTE — Progress Notes (Signed)
Received labs from PCP but CBC from 02/2012.  03/06/12: H/H 10.3/31.4.  Please find out if patient has had labs this month with Dr. Dwana Melena. If so, get a copy. If not, please have him get CBC done.

## 2012-07-11 NOTE — Progress Notes (Signed)
I called Roberto Nelson at Dr. Scharlene Gloss. Pt has ot had any labs this month. I will call the pt tomorrow.

## 2012-07-12 ENCOUNTER — Encounter: Payer: Self-pay | Admitting: Gastroenterology

## 2012-07-12 LAB — HEMOGLOBIN AND HEMATOCRIT, BLOOD
HCT: 31 %
Hemoglobin: 10.3 g/dL — AB (ref 13.5–17.5)

## 2012-07-12 NOTE — Progress Notes (Signed)
LMOM for pt to call. Lab order faxed to [facility].  

## 2012-07-12 NOTE — Progress Notes (Signed)
Pt returned call and was informed.  

## 2012-07-13 LAB — CBC WITH DIFFERENTIAL/PLATELET
Basophils Absolute: 0 10*3/uL (ref 0.0–0.1)
Basophils Relative: 0 % (ref 0–1)
Hemoglobin: 13.6 g/dL (ref 13.0–17.0)
MCHC: 34.1 g/dL (ref 30.0–36.0)
Monocytes Relative: 9 % (ref 3–12)
Neutro Abs: 4.3 10*3/uL (ref 1.7–7.7)
Neutrophils Relative %: 57 % (ref 43–77)
RDW: 15.7 % — ABNORMAL HIGH (ref 11.5–15.5)

## 2012-07-19 NOTE — Progress Notes (Signed)
Quick Note:  Called and informed pt. ______ 

## 2012-07-19 NOTE — Progress Notes (Signed)
Quick Note:  H/H is now normal. Can stop iron. Still needs f/u EGD as scheduled. Send copy to PCP. ______

## 2012-07-21 ENCOUNTER — Encounter (HOSPITAL_COMMUNITY)
Admission: RE | Disposition: A | Discharge status: Home / Self Care | Payer: Self-pay | Source: Ambulatory Visit | Attending: Gastroenterology

## 2012-07-21 ENCOUNTER — Encounter (HOSPITAL_COMMUNITY): Payer: Self-pay | Admitting: *Deleted

## 2012-07-21 ENCOUNTER — Ambulatory Visit (HOSPITAL_COMMUNITY)
Admission: RE | Admit: 2012-07-21 | Discharge: 2012-07-21 | Disposition: A | Discharge status: Home / Self Care | Payer: Medicare Other | Source: Ambulatory Visit | Attending: Gastroenterology | Admitting: Gastroenterology

## 2012-07-21 DIAGNOSIS — K2940 Chronic atrophic gastritis without bleeding: Secondary | ICD-10-CM | POA: Insufficient documentation

## 2012-07-21 DIAGNOSIS — D649 Anemia, unspecified: Secondary | ICD-10-CM

## 2012-07-21 DIAGNOSIS — I10 Essential (primary) hypertension: Secondary | ICD-10-CM | POA: Insufficient documentation

## 2012-07-21 DIAGNOSIS — K277 Chronic peptic ulcer, site unspecified, without hemorrhage or perforation: Secondary | ICD-10-CM | POA: Insufficient documentation

## 2012-07-21 DIAGNOSIS — K2970 Gastritis, unspecified, without bleeding: Secondary | ICD-10-CM

## 2012-07-21 DIAGNOSIS — K279 Peptic ulcer, site unspecified, unspecified as acute or chronic, without hemorrhage or perforation: Secondary | ICD-10-CM

## 2012-07-21 DIAGNOSIS — K2990 Gastroduodenitis, unspecified, without bleeding: Secondary | ICD-10-CM

## 2012-07-21 DIAGNOSIS — F17200 Nicotine dependence, unspecified, uncomplicated: Secondary | ICD-10-CM | POA: Insufficient documentation

## 2012-07-21 DIAGNOSIS — Z8711 Personal history of peptic ulcer disease: Secondary | ICD-10-CM

## 2012-07-21 HISTORY — PX: ESOPHAGOGASTRODUODENOSCOPY: SHX5428

## 2012-07-21 SURGERY — EGD (ESOPHAGOGASTRODUODENOSCOPY)
Anesthesia: Moderate Sedation

## 2012-07-21 MED ORDER — BUTAMBEN-TETRACAINE-BENZOCAINE 2-2-14 % EX AERO
INHALATION_SPRAY | CUTANEOUS | Status: DC | PRN
  Administered 2012-07-21: 2 via TOPICAL

## 2012-07-21 MED ORDER — STERILE WATER FOR IRRIGATION IR SOLN
Status: DC | PRN
  Administered 2012-07-21: 09:00:00

## 2012-07-21 MED ORDER — MIDAZOLAM HCL 5 MG/5ML IJ SOLN
INTRAMUSCULAR | Status: DC | PRN
  Administered 2012-07-21: 1 mg via INTRAVENOUS
  Administered 2012-07-21: 2 mg via INTRAVENOUS

## 2012-07-21 MED ORDER — MEPERIDINE HCL 100 MG/ML IJ SOLN
INTRAMUSCULAR | Status: DC | PRN
  Administered 2012-07-21: 25 mg via INTRAVENOUS

## 2012-07-21 MED ORDER — MIDAZOLAM HCL 5 MG/5ML IJ SOLN
INTRAMUSCULAR | Status: AC
  Filled 2012-07-21: qty 10

## 2012-07-21 MED ORDER — SODIUM CHLORIDE 0.9 % IV SOLN
INTRAVENOUS | Status: DC
  Administered 2012-07-21: 1000 mL via INTRAVENOUS

## 2012-07-21 MED ORDER — MEPERIDINE HCL 100 MG/ML IJ SOLN
INTRAMUSCULAR | Status: AC
  Filled 2012-07-21: qty 1

## 2012-07-21 NOTE — Op Note (Addendum)
[facility] [address] [place], [address]   ENDOSCOPY PROCEDURE REPORT  PATIENT: Roberto Nelson, Roberto Nelson  MR#: [id number] BIRTHDATE: 1934-03-08 , 79  yrs. old GENDER: Male  ENDOSCOPIST: Jonette Eva, MD REFERRED NF:AOZH Hall, M.D.  PROCEDURE DATE: 07/21/2012 PROCEDURE:   EGD w/ biopsy  INDICATIONS:   egd dec 2013 large gstric ulcerin pt using ASA AND H PYLORI POSITIVE. TOOK PYLERA W/O SIDE EFFECTS. MEDICATIONS: Demerol 25 mg IV and Versed 3 mg IV TOPICAL ANESTHETIC:   Cetacaine Spray  DESCRIPTION OF PROCEDURE:     Physical exam was performed.  Informed consent was obtained from the patient after explaining the benefits, risks, and alternatives to the procedure.  The patient was connected to the monitor and placed in the left lateral position.  Continuous oxygen was provided by nasal cannula and IV medicine administered through an indwelling cannula.  After administration of sedation, the patients esophagus was intubated and the EG-[id number] ([id number])  endoscope was advanced under direct visualization to the second portion of the duodenum.  The scope was removed slowly by carefully examining the color, texture, anatomy, and integrity of the mucosa on the way out.  The patient was recovered in endoscopy and discharged home in satisfactory condition. NO IMAGES AVAILABLE DUE TO TECHNICAL DIFFICULTIES     ESOPHAGUS: The mucosa of the esophagus appeared normal.  STOMACH: Moderate non-erosive gastritis (inflammation) was found in the gastric antrum.  Multiple biopsies were performed using cold forceps.  DUODENUM: The duodenal mucosa showed no abnormalities in the bulb and second portion of the duodenum. COMPLICATIONS:   None  ENDOSCOPIC IMPRESSION: 1.   MODERATE Non-erosive gastritis  RECOMMENDATIONS: AWAIT BIOPSY PROTONIX BID LOW FAT DIET OPV IN 4 MOS   REPEAT EXAM:   _______________________________ Rosalie DoctorJonette Eva, MD 07/21/2012 9:21  AM

## 2012-07-21 NOTE — H&P (Signed)
Primary Care Physician:  Dwana Melena, MD Primary Gastroenterologist:  Dr. Darrick Penna  Pre-Procedure History & Physical: HPI:  Roberto Nelson is a 79 y.o. male here for peptic ulcer disease.  Past Medical History  Diagnosis Date  . Edema   . Atherosclerosis   . Tobacco user   . Heart murmur   . Anginal pain   . Hypertension     [place]  [place]   . Shortness of breath     WITH EXERTION   . Cough   . CORONARY ATHEROSCLEROSIS NATIVE CORONARY ARTERY 11/03/2009    Qualifier: Diagnosis of  By: Via LPN, Larita Fife    . COPD 11/03/2009    Qualifier: Diagnosis of  By: Via LPN, Larita Fife    . TOBACCO ABUSE 11/03/2009    Qualifier: Diagnosis of  By: Daleen Squibb, MD, Lorenza Cambridge Incarcerated inguinal hernia, unilateral 02/14/2012    Status post repair, per Dr. Abbey Chatters  . Right bundle branch block and left anterior fascicular block 12/30/2011  . Anemia   . Antral ulcer 02/25/2012  . Gastritis and duodenitis 02/25/2012    H.Pylori s/p Pylera    Past Surgical History  Procedure Laterality Date  . Colonoscopy      IN [place]OUND [place], NEGATIVE PER PATIENT  . Cardiac catheterization      2005 [place]   . Coronary angioplasty    . Inguinal hernia repair  02/14/2012    Procedure: HERNIA REPAIR INGUINAL ADULT;  Surgeon: Adolph Pollack, MD;  Location: [facility] OR;  Service: General;  Laterality: Right;  . Insertion of mesh  02/14/2012    Procedure: INSERTION OF MESH;  Surgeon: Adolph Pollack, MD;  Location: [facility] OR;  Service: General;  Laterality: Right;  . Esophagogastroduodenoscopy  02/25/2012    ZOX:WRUEA hiatal hernia/Large non-bleeding ulcer was found in the gastric antrum/MILD Duodenal inflammation. H.Pylori gastritis on bx    Prior to Admission medications   Medication Sig Start Date End Date Taking? Authorizing Provider  albuterol (PROVENTIL HFA;VENTOLIN HFA) 108 (90 BASE) MCG/ACT inhaler Inhale 2 puffs into the lungs every 6 (six) hours as needed for wheezing (Or chest  congestion.). 02/26/12  Yes Elliot Cousin, MD  ferrous sulfate 325 (65 FE) MG tablet Take 325 mg by mouth daily with breakfast.   Yes Historical Provider, MD  furosemide (LASIX) 40 MG tablet Take 0.5 tablets (20 mg total) by mouth daily. Dose changed to half a tablet daily. 02/26/12  Yes Elliot Cousin, MD  lisinopril (PRINIVIL,ZESTRIL) 20 MG tablet Take 20 mg by mouth daily.   Yes Historical Provider, MD  metoprolol (TOPROL-XL) 50 MG 24 hr tablet Take 50 mg by mouth daily.     Yes Historical Provider, MD  Multiple Vitamins-Iron (MULTIVITAMINS WITH IRON) TABS Take 1 tablet by mouth daily. 02/26/12  Yes Elliot Cousin, MD  pantoprazole (PROTONIX) 40 MG tablet Take 1 tablet (40 mg total) by mouth 2 (two) times daily before a meal. For treatment of ulcer. 02/26/12  Yes Elliot Cousin, MD  nitroGLYCERIN (NITROSTAT) 0.4 MG SL tablet Place 1 tablet (0.4 mg total) under the tongue as needed. 12/30/11   Gaylord Shih, MD    Allergies as of 06/27/2012 - Review Complete 06/27/2012  Allergen Reaction Noted  . Penicillins Itching 11/03/2009    Family History  Problem Relation Age of Onset  . Colon cancer Neg Hx   . Liver disease Neg Hx     History   Social History  . Marital Status:  Widowed    Spouse Name: N/A    Number of Children: 1  . Years of Education: N/A   Occupational History  . Retired    Social History Main Topics  . Smoking status: Light Tobacco Smoker -- 0.10 packs/day for 50 years    Types: Cigarettes  . Smokeless tobacco: Not on file  . Alcohol Use: Yes     Comment: BEER/cocktail socially. No heavy use per patient.   . Drug Use: No  . Sexually Active: Not on file   Other Topics Concern  . Not on file   Social History Narrative   Exercises regularly   1 daughter    Review of Systems: See HPI, otherwise negative ROS   Physical Exam: BP 159/94  Pulse 80  Temp(Src) 98.2 F (36.8 C) (Oral)  Resp 26  Ht 5\' 7"  (1.702 m)  Wt 161 lb (73.029 kg)  BMI 25.21 kg/m2   SpO2 92% General:   Alert,  pleasant and cooperative in NAD Head:  Normocephalic and atraumatic. Neck:  Supple; Lungs:  Clear throughout to auscultation.    Heart:  Regular rate and rhythm. Abdomen:  Soft, nontender and nondistended. Normal bowel sounds, without guarding, and without rebound.   Neurologic:  Alert and  oriented x4;  grossly normal neurologically.  Impression/Plan:     PUD  PLAN:  REPEAT EGD TO CONFIRM ULCERS ARE HEALED.

## 2012-07-25 ENCOUNTER — Encounter (HOSPITAL_COMMUNITY): Payer: Self-pay | Admitting: Gastroenterology

## 2012-08-01 ENCOUNTER — Telehealth: Payer: Self-pay | Admitting: Gastroenterology

## 2012-08-01 NOTE — Telephone Encounter (Signed)
Cc PCP 

## 2012-08-01 NOTE — Telephone Encounter (Signed)
Please call pt. His stomach Bx shows gastritis.   TAKE PROTONIX 30 MINUTES PRIOR TO MEALS TWICE DAILY. AVOID TRIGGERS FOR GASTRITIS.  FOLLOW A LOW FAT DIET.  FOLLOW UP IN 4 MOS E15 GASTRITIS.

## 2012-08-02 NOTE — Telephone Encounter (Signed)
Reminder in epic °

## 2012-08-03 NOTE — Telephone Encounter (Signed)
Called and informed pt.  

## 2012-08-14 ENCOUNTER — Other Ambulatory Visit: Payer: Self-pay | Admitting: Cardiology

## 2012-08-23 ENCOUNTER — Other Ambulatory Visit: Payer: Self-pay | Admitting: *Deleted

## 2012-08-23 ENCOUNTER — Telehealth: Payer: Self-pay | Admitting: *Deleted

## 2012-08-23 NOTE — Telephone Encounter (Signed)
Noted incoming fax from patient pharmacy to request medication for Lasix. Contacted patient to confirm taking 40 mg daily. Please advise if a follow up is needed.

## 2012-08-30 ENCOUNTER — Other Ambulatory Visit: Payer: Self-pay | Admitting: *Deleted

## 2012-08-30 MED ORDER — FUROSEMIDE 40 MG PO TABS: 20.0000 mg | ORAL_TABLET | Freq: Every day | ORAL | Status: DC

## 2012-09-01 NOTE — Telephone Encounter (Signed)
Needs follow-up in October for yearly if he is asymptomatic

## 2012-09-04 NOTE — Telephone Encounter (Signed)
Called pt to confirm if he was asymptomatic. Advised he will receive a letter in October to call office to schedule annual follow up. Pt complained of arm rash, advised to contact pcp. Advised to call office if he has any further concerns.

## 2012-09-06 ENCOUNTER — Other Ambulatory Visit: Payer: Self-pay | Admitting: *Deleted

## 2012-09-06 MED ORDER — FUROSEMIDE 40 MG PO TABS: 20.0000 mg | ORAL_TABLET | Freq: Every day | ORAL | Status: DC

## 2012-09-27 NOTE — Progress Notes (Signed)
EGD MAY 2014 GASTRITIS  REVIEWED.

## 2012-10-23 ENCOUNTER — Encounter: Payer: Self-pay | Admitting: Gastroenterology

## 2013-03-02 ENCOUNTER — Institutional Professional Consult (permissible substitution): Payer: Medicare Other | Admitting: Internal Medicine

## 2013-03-05 ENCOUNTER — Institutional Professional Consult (permissible substitution): Payer: Medicare Other | Admitting: Internal Medicine

## 2013-03-19 ENCOUNTER — Encounter: Payer: Self-pay | Admitting: Internal Medicine

## 2013-03-19 ENCOUNTER — Ambulatory Visit (INDEPENDENT_AMBULATORY_CARE_PROVIDER_SITE_OTHER): Payer: Medicare Other | Admitting: Internal Medicine

## 2013-03-19 VITALS — BP 124/60 | HR 63 | Temp 97.8°F | Ht 67.0 in | Wt 181.0 lb

## 2013-03-19 DIAGNOSIS — J9610 Chronic respiratory failure, unspecified whether with hypoxia or hypercapnia: Secondary | ICD-10-CM

## 2013-03-19 DIAGNOSIS — J449 Chronic obstructive pulmonary disease, unspecified: Secondary | ICD-10-CM

## 2013-03-19 MED ORDER — BUDESONIDE-FORMOTEROL FUMARATE 160-4.5 MCG/ACT IN AERO: INHALATION_SPRAY | RESPIRATORY_TRACT | Status: AC

## 2013-03-19 NOTE — Patient Instructions (Addendum)
02 should be 2lpm with rest and 3lpm with exertion   Start symbicort 160 Take 2 puffs first thing in am and then another 2 puffs about 12 hours later.   Continue spiriva one each am  Only use your albuterol(proair) as a rescue medication to be used if you can't catch your breath by resting or doing a relaxed purse lip breathing pattern.  - The less you use it, the better it will work when you need it. - Ok to use up to every 4 hours if you must but call for immediate appointment if use goes up over your usual need - Don't leave home without it !!  (think of it like your spare tire for your car)   Please schedule a follow up office visit in 4 weeks, sooner if needed with pfts and cxr on return

## 2013-03-19 NOTE — Progress Notes (Signed)
Subjective:     Patient ID: Roberto Nelson, male   DOB: 1933-09-26   MRN: [id number]  HPI  [age] yowm quit smoking 11/2012 and aware of doe x 2013 but worse since quit so referred 03/19/2013 by Dr Catalina PizzaZach Hall for copd eval/ 02 dependency/ pc02 49  03/19/2013 1st [facility] office visit/ Wert cc indolent onset x > one year progressive doe x 100 ft,  With 02 walk s difficulty at mod pace "forever". Just stopped acei and started arb maybe 3 m prior to OV ,  spiriva daily plus proair need every 3-4 days.   No obvious patterns in day to day or daytime variabilty or assoc chronic cough or cp or chest tightness, subjective wheeze overt sinus or hb symptoms. No unusual exp hx or h/o childhood pna/ asthma or knowledge of premature birth.  Sleeping ok without nocturnal  or early am exacerbation  of respiratory  c/o's or need for noct saba. Also denies any obvious fluctuation of symptoms with weather or environmental changes or other aggravating or alleviating factors except as outlined above   Current Medications, Allergies, Complete Past Medical History, Past Surgical History, Family History, and Social History were reviewed in [facility] electronic medical record.  ROS  The following are not active complaints unless bolded sore throat, dysphagia, dental problems, itching, sneezing,  nasal congestion or excess/ purulent secretions, ear ache,   fever, chills, sweats, unintended wt loss, pleuritic or exertional cp, hemoptysis,  orthopnea pnd or leg swelling, presyncope, palpitations, heartburn, abdominal pain, anorexia, nausea, vomiting, diarrhea  or change in bowel or urinary habits, change in stools or urine, dysuria,hematuria,  rash, arthralgias, visual complaints, headache, numbness weakness or ataxia or problems with walking or coordination,  change in mood/affect or memory.          Review of Systems     Objective:   Physical Exam amb wm tends to mumble and hard to understand  Wt Readings  from Last 3 Encounters:  03/19/13 181 lb (82.101 kg)  07/21/12 161 lb (73.029 kg)  07/21/12 161 lb (73.029 kg)      HEENT mild turbinate edema.  Oropharynx edentulous no thrush or excess pnd or cobblestoning.  No JVD or cervical adenopathy. Mild accessory muscle hypertrophy. Trachea midline, nl thryroid. Chest was hyperinflated by percussion with diminished breath sounds and moderate increased exp time without wheeze. Hoover sign positive at mid inspiration. Regular rate and rhythm without murmur gallop or rub or increase P2. 2+ pitting sym bilateral.  Abd: no hsm, nl excursion. Ext warm without cyanosis or clubbing.     cxr's not avail / labs not avail at ov    Assessment:

## 2013-03-20 DIAGNOSIS — J9610 Chronic respiratory failure, unspecified whether with hypoxia or hypercapnia: Secondary | ICD-10-CM | POA: Insufficient documentation

## 2013-03-20 NOTE — Assessment & Plan Note (Addendum)
DDX of  difficult airways managment all start with A and  include Adherence, Ace Inhibitors, Acid Reflux, Active Sinus Disease, Alpha 1 Antitripsin deficiency, Anxiety masquerading as Airways dz,  ABPA,  allergy(esp in young), Aspiration (esp in elderly), Adverse effects of DPI,  Active smokers, plus two Bs  = Bronchiectasis and Beta blocker use..and one C= CHF  Adherence is always the initial "prime suspect" and is a multilayered concern that requires a "trust but verify" approach in every patient - starting with knowing how to use medications, especially inhalers, correctly, keeping up with refills and understanding the fundamental difference between maintenance and prns vs those medications only taken for a very short course and then stopped and not refilled.  The proper method of use, as well as anticipated side effects, of a metered-dose inhaler are discussed and demonstrated to the patient. Improved effectiveness after extensive coaching during this visit to a level of approximately  75% so added symbicort 60 2bid   ? Active smoking> pt denies since 11/2012

## 2013-03-20 NOTE — Assessment & Plan Note (Addendum)
-   hc03 levels > 30 since 12/30/11  - pC02 reported 49 per Dr Margo AyeHall ? Date  - 03/20/2013  Walked 3lpm  2 laps @ 185 ft each stopped due to sob / only made it 20 ft on 2lpm before desat to 87%  Clearly chronically hypercarbic by previous bicarb levels suggesting advanced emphysema, probably should have tsh to complete the w/u when returns for pft's  For now needs 02 at 2lpmg 24/7 but 3 lpm walking more than room to room at home

## 2013-04-19 ENCOUNTER — Ambulatory Visit: Payer: Medicare Other | Admitting: Internal Medicine

## 2013-05-18 ENCOUNTER — Ambulatory Visit: Payer: Medicare Other | Admitting: Internal Medicine

## 2013-09-17 ENCOUNTER — Ambulatory Visit (HOSPITAL_COMMUNITY)
Admission: RE | Admit: 2013-09-17 | Discharge: 2013-09-17 | Disposition: A | Discharge status: Home / Self Care | Payer: Medicare HMO | Source: Ambulatory Visit | Attending: Internal Medicine | Admitting: Internal Medicine

## 2013-09-17 ENCOUNTER — Other Ambulatory Visit (HOSPITAL_COMMUNITY): Payer: Self-pay | Admitting: Internal Medicine

## 2013-09-17 DIAGNOSIS — R0602 Shortness of breath: Secondary | ICD-10-CM

## 2013-09-17 DIAGNOSIS — J438 Other emphysema: Secondary | ICD-10-CM | POA: Insufficient documentation

## 2013-09-21 ENCOUNTER — Emergency Department (HOSPITAL_COMMUNITY): Payer: Medicare HMO

## 2013-09-21 ENCOUNTER — Encounter (HOSPITAL_COMMUNITY): Payer: Self-pay | Admitting: Emergency Medicine

## 2013-09-21 ENCOUNTER — Emergency Department (HOSPITAL_COMMUNITY)
Admission: EM | Admit: 2013-09-21 | Discharge: 2013-09-21 | Disposition: A | Discharge status: Home / Self Care | Payer: Medicare HMO | Attending: Emergency Medicine | Admitting: Emergency Medicine

## 2013-09-21 DIAGNOSIS — Z87891 Personal history of nicotine dependence: Secondary | ICD-10-CM | POA: Insufficient documentation

## 2013-09-21 DIAGNOSIS — Z9861 Coronary angioplasty status: Secondary | ICD-10-CM | POA: Insufficient documentation

## 2013-09-21 DIAGNOSIS — I10 Essential (primary) hypertension: Secondary | ICD-10-CM | POA: Insufficient documentation

## 2013-09-21 DIAGNOSIS — Z8719 Personal history of other diseases of the digestive system: Secondary | ICD-10-CM | POA: Insufficient documentation

## 2013-09-21 DIAGNOSIS — Z79899 Other long term (current) drug therapy: Secondary | ICD-10-CM | POA: Insufficient documentation

## 2013-09-21 DIAGNOSIS — Z862 Personal history of diseases of the blood and blood-forming organs and certain disorders involving the immune mechanism: Secondary | ICD-10-CM | POA: Insufficient documentation

## 2013-09-21 DIAGNOSIS — I2510 Atherosclerotic heart disease of native coronary artery without angina pectoris: Secondary | ICD-10-CM | POA: Insufficient documentation

## 2013-09-21 DIAGNOSIS — R112 Nausea with vomiting, unspecified: Secondary | ICD-10-CM | POA: Insufficient documentation

## 2013-09-21 DIAGNOSIS — R110 Nausea: Secondary | ICD-10-CM

## 2013-09-21 DIAGNOSIS — J441 Chronic obstructive pulmonary disease with (acute) exacerbation: Secondary | ICD-10-CM | POA: Insufficient documentation

## 2013-09-21 DIAGNOSIS — R011 Cardiac murmur, unspecified: Secondary | ICD-10-CM | POA: Insufficient documentation

## 2013-09-21 DIAGNOSIS — Z9889 Other specified postprocedural states: Secondary | ICD-10-CM | POA: Insufficient documentation

## 2013-09-21 DIAGNOSIS — R609 Edema, unspecified: Secondary | ICD-10-CM | POA: Insufficient documentation

## 2013-09-21 DIAGNOSIS — Z880 Allergy status to penicillin: Secondary | ICD-10-CM | POA: Insufficient documentation

## 2013-09-21 DIAGNOSIS — M25569 Pain in unspecified knee: Secondary | ICD-10-CM | POA: Insufficient documentation

## 2013-09-21 DIAGNOSIS — Z7982 Long term (current) use of aspirin: Secondary | ICD-10-CM | POA: Insufficient documentation

## 2013-09-21 LAB — CBC WITH DIFFERENTIAL/PLATELET
BASOS PCT: 0 % (ref 0–1)
Basophils Absolute: 0 10*3/uL (ref 0.0–0.1)
Eosinophils Absolute: 0.2 10*3/uL (ref 0.0–0.7)
Eosinophils Relative: 2 % (ref 0–5)
HCT: 35.3 % — ABNORMAL LOW (ref 39.0–52.0)
HEMOGLOBIN: 11.7 g/dL — AB (ref 13.0–17.0)
LYMPHS PCT: 19 % (ref 12–46)
Lymphs Abs: 1.6 10*3/uL (ref 0.7–4.0)
MCH: 33.3 pg (ref 26.0–34.0)
MCHC: 33.1 g/dL (ref 30.0–36.0)
MCV: 100.6 fL — ABNORMAL HIGH (ref 78.0–100.0)
MONOS PCT: 9 % (ref 3–12)
Monocytes Absolute: 0.8 10*3/uL (ref 0.1–1.0)
NEUTROS PCT: 70 % (ref 43–77)
Neutro Abs: 6 10*3/uL (ref 1.7–7.7)
Platelets: 131 10*3/uL — ABNORMAL LOW (ref 150–400)
RBC: 3.51 MIL/uL — AB (ref 4.22–5.81)
RDW: 12.7 % (ref 11.5–15.5)
WBC: 8.6 10*3/uL (ref 4.0–10.5)

## 2013-09-21 LAB — COMPREHENSIVE METABOLIC PANEL
ALBUMIN: 3.7 g/dL (ref 3.5–5.2)
ALK PHOS: 51 U/L (ref 39–117)
ALT: 18 U/L (ref 0–53)
AST: 51 U/L — ABNORMAL HIGH (ref 0–37)
Anion gap: 8 (ref 5–15)
BUN: 63 mg/dL — ABNORMAL HIGH (ref 6–23)
CHLORIDE: 89 meq/L — AB (ref 96–112)
CO2: 43 mEq/L (ref 19–32)
Calcium: 9.3 mg/dL (ref 8.4–10.5)
Creatinine, Ser: 2.8 mg/dL — ABNORMAL HIGH (ref 0.50–1.35)
GFR calc Af Amer: 23 mL/min — ABNORMAL LOW (ref >90)
GFR calc non Af Amer: 20 mL/min — ABNORMAL LOW (ref >90)
Glucose, Bld: 124 mg/dL — ABNORMAL HIGH (ref 70–99)
POTASSIUM: 4.6 meq/L (ref 3.7–5.3)
Sodium: 140 mEq/L (ref 137–147)
Total Bilirubin: 0.7 mg/dL (ref 0.3–1.2)
Total Protein: 7 g/dL (ref 6.0–8.3)

## 2013-09-21 LAB — URINALYSIS, ROUTINE W REFLEX MICROSCOPIC
Bilirubin Urine: NEGATIVE
Glucose, UA: NEGATIVE mg/dL
Hgb urine dipstick: NEGATIVE
Ketones, ur: NEGATIVE mg/dL
Leukocytes, UA: NEGATIVE
Nitrite: NEGATIVE
Protein, ur: NEGATIVE mg/dL
SPECIFIC GRAVITY, URINE: 1.01 (ref 1.005–1.030)
Urobilinogen, UA: 0.2 mg/dL (ref 0.0–1.0)
pH: 5.5 (ref 5.0–8.0)

## 2013-09-21 LAB — LIPASE, BLOOD: Lipase: 69 U/L — ABNORMAL HIGH (ref 11–59)

## 2013-09-21 MED ORDER — ONDANSETRON 4 MG PO TBDP: ORAL_TABLET | ORAL | Status: DC

## 2013-09-21 MED ORDER — PANTOPRAZOLE SODIUM 20 MG PO TBEC: 20.0000 mg | DELAYED_RELEASE_TABLET | Freq: Every day | ORAL | Status: DC

## 2013-09-21 MED ORDER — SODIUM CHLORIDE 0.9 % IV BOLUS (SEPSIS)
1000.0000 mL | Freq: Once | INTRAVENOUS | Status: AC
  Administered 2013-09-21: 1000 mL via INTRAVENOUS

## 2013-09-21 MED ORDER — HYDROCODONE-ACETAMINOPHEN 5-325 MG PO TABS
1.0000 | ORAL_TABLET | Freq: Once | ORAL | Status: AC
  Administered 2013-09-21: 1 via ORAL
  Filled 2013-09-21: qty 1

## 2013-09-21 MED ORDER — ONDANSETRON HCL 4 MG/2ML IJ SOLN: 4.0000 mg | Freq: Once | INTRAMUSCULAR | Status: DC

## 2013-09-21 MED ORDER — ONDANSETRON 4 MG PO TBDP
4.0000 mg | ORAL_TABLET | Freq: Once | ORAL | Status: AC
  Administered 2013-09-21: 4 mg via ORAL
  Filled 2013-09-21: qty 1

## 2013-09-21 NOTE — Discharge Instructions (Signed)
Follow up with your md next week. °

## 2013-09-21 NOTE — ED Notes (Addendum)
Pt reports abdominal pain,dizziness, and nausea that started last night. Pt reports vomiting x1 today.

## 2013-09-21 NOTE — ED Notes (Signed)
Pt had no ride home, so [place] police department gave patient ride to his residence.

## 2013-09-21 NOTE — ED Notes (Signed)
CRITICAL VALUE ALERT  Critical value received:  co2 43  Date of notification:  09/21/13  Time of notification:  2000  Critical value read back:Yes.    Nurse who received alert:  Mignon PineJaimie Armstrong RN  MD notified (1st page):  Dr. Estell HarpinZammit  Time of first page:  1958  MD notified (2nd page):  Time of second page:  Responding MD:  Dr. Estell HarpinZammit  Time MD responded:  2000

## 2013-09-21 NOTE — ED Provider Notes (Signed)
CSN: [id number]     Arrival date & time 09/21/13  1839 History   First MD Initiated Contact with Patient 09/21/13 1848    This chart was scribed for Roberto Lennert, MD by Marica Otter, ED Scribe. This patient was seen in room APA12/APA12 and the patient's care was started at 6:57 PM.  Chief Complaint  Patient presents with  . Abdominal Pain   Patient is a 80 y.o. male presenting with vomiting and knee pain. The history is provided by the patient. No language interpreter was used.  Emesis Severity:  Moderate Timing:  Intermittent Number of daily episodes:  2 Chronicity:  New Context: not self-induced   Associated symptoms: no abdominal pain, no diarrhea and no headaches   Knee Pain Location:  Leg Leg location:  R leg Pain details:    Radiates to:  Does not radiate   Timing:  Intermittent Associated symptoms: swelling   Associated symptoms: no back pain, no fatigue and no fever    HPI Comments: Roberto Nelson is a 80 y.o. male, with an extensive medical Hx noted below, who presents to the Emergency Department complaining of nausea with associated vomiting onset today. Pt reports 2 episodes of vomiting, with the last episode taking place one hour ago.   Pt also complains of right knee pain onset 2 days ago, though pt notes he is not feeling any pain presently.   Past Medical History  Diagnosis Date  . Edema   . Atherosclerosis   . Tobacco user   . Heart murmur   . Anginal pain   . Hypertension     [place]  [place]   . Shortness of breath     WITH EXERTION   . Cough   . CORONARY ATHEROSCLEROSIS NATIVE CORONARY ARTERY 11/03/2009    Qualifier: Diagnosis of  By: Via LPN, Larita Fife    . COPD 11/03/2009    Qualifier: Diagnosis of  By: Via LPN, Larita Fife    . TOBACCO ABUSE 11/03/2009    Qualifier: Diagnosis of  By: Daleen Squibb, MD, Lorenza Cambridge Incarcerated inguinal hernia, unilateral 02/14/2012    Status post repair, per Dr. Abbey Chatters  . Right bundle branch block and left  anterior fascicular block 12/30/2011  . Anemia   . Antral ulcer 02/25/2012  . Gastritis and duodenitis 02/25/2012    H.Pylori s/p Pylera   Past Surgical History  Procedure Laterality Date  . Colonoscopy      [place], NEGATIVE PER PATIENT  . Cardiac catheterization      2005 [place]   . Coronary angioplasty    . Inguinal hernia repair  02/14/2012    Procedure: HERNIA REPAIR INGUINAL ADULT;  Surgeon: Adolph Pollack, MD;  Location: [facility] OR;  Service: General;  Laterality: Right;  . Insertion of mesh  02/14/2012    Procedure: INSERTION OF MESH;  Surgeon: Adolph Pollack, MD;  Location: [facility] OR;  Service: General;  Laterality: Right;  . Esophagogastroduodenoscopy  02/25/2012    WUJ:WJXBJ hiatal hernia/Large non-bleeding ulcer was found in the gastric antrum/MILD Duodenal inflammation. H.Pylori gastritis on bx  . Esophagogastroduodenoscopy N/A 07/21/2012    Procedure: ESOPHAGOGASTRODUODENOSCOPY (EGD);  Surgeon: [place]ali, MD;  [facility];  Service: Endoscopy;  Laterality: N/A;  8:30   Family History  Problem Relation Age of Onset  . Colon cancer Neg Hx   . Liver disease Neg Hx    History  Substance Use Topics  .  Smoking status: Former Smoker -- 0.50 packs/day for 50 years    Types: Cigarettes    Quit date: 11/13/2012  . Smokeless tobacco: Not on file  . Alcohol Use: Yes     Comment: BEER/cocktail socially. No heavy use per patient.     Review of Systems  Constitutional: Negative for fever, appetite change and fatigue.  HENT: Negative for congestion, ear discharge and sinus pressure.   Eyes: Negative for discharge.  Respiratory: Negative for cough.   Cardiovascular: Negative for chest pain.  Gastrointestinal: Positive for nausea and vomiting. Negative for abdominal pain and diarrhea.  Genitourinary: Negative for frequency and hematuria.  Musculoskeletal: Negative for back pain.       Right knee pain  Skin: Negative for rash.  Neurological:  Negative for seizures and headaches.  Psychiatric/Behavioral: Negative for hallucinations.      Allergies  Penicillins  Home Medications   Prior to Admission medications   Medication Sig Start Date End Date Taking? Authorizing Provider  albuterol (PROAIR HFA) 108 (90 BASE) MCG/ACT inhaler Inhale 2 puffs into the lungs every 6 (six) hours as needed for wheezing or shortness of breath.    Historical Provider, MD  ALPRAZolam Prudy Feeler) 0.25 MG tablet Take 0.25 mg by mouth every 6 (six) hours as needed for anxiety.    Historical Provider, MD  aspirin 81 MG chewable tablet Chew 81 mg by mouth daily.    Historical Provider, MD  atorvastatin (LIPITOR) 40 MG tablet Take 40 mg by mouth daily.    Historical Provider, MD  budesonide-formoterol (SYMBICORT) 160-4.5 MCG/ACT inhaler Take 2 puffs first thing in am and then another 2 puffs about 12 hours later. 03/19/13   Nyoka Cowden, MD  furosemide (LASIX) 40 MG tablet Take 40 mg by mouth 2 (two) times daily. 09/06/12   Gaylord Shih, MD  losartan (COZAAR) 100 MG tablet Take 100 mg by mouth daily.    Historical Provider, MD  metoprolol (TOPROL-XL) 50 MG 24 hr tablet Take 50 mg by mouth daily.      Historical Provider, MD  pantoprazole (PROTONIX) 40 MG tablet Take 1 tablet (40 mg total) by mouth 2 (two) times daily before a meal. For treatment of ulcer. 02/26/12   Elliot Cousin, MD  tiotropium (SPIRIVA) 18 MCG inhalation capsule Place 18 mcg into inhaler and inhale daily.    Historical Provider, MD   Triage Vitals: BP 142/70  Pulse 64  Temp(Src) 98.3 F (36.8 C) (Oral)  Resp 20  Ht 5\' 7"  (1.702 m)  Wt 185 lb (83.915 kg)  BMI 28.97 kg/m2  SpO2 96% Physical Exam  Constitutional: He is oriented to person, place, and time. He appears well-developed.  HENT:  Head: Normocephalic.  Eyes: Conjunctivae and EOM are normal. No scleral icterus.  Neck: Neck supple. No thyromegaly present.  Cardiovascular: Normal rate and regular rhythm.  Exam reveals no gallop  and no friction rub.   No murmur heard. Pulmonary/Chest: No stridor. He has wheezes (mild wheezing bilaterally ). He has no rales. He exhibits no tenderness.  Abdominal: He exhibits no distension. There is no tenderness. There is no rebound.  Musculoskeletal: Normal range of motion. He exhibits edema (Bilateral lower extremities).  Lymphadenopathy:    He has no cervical adenopathy.  Neurological: He is oriented to person, place, and time. He exhibits normal muscle tone. Coordination normal.  Skin: No rash noted. No erythema.  Psychiatric: He has a normal mood and affect. His behavior is normal.    ED Course  Procedures (including critical care time)  COORDINATION OF CARE: 6:59 PM-Discussed treatment plan which includes EKG, meds, imaging and labs with pt at bedside and pt agreed to plan.  8:12Pm: recheck: pt continues to complain of nausea and right leg/foot pain. Discussed administering fluids via IV, Patient verbalizes understanding and agrees with treatment plan. Labs Review Labs Reviewed  CBC WITH DIFFERENTIAL  COMPREHENSIVE METABOLIC PANEL  LIPASE, BLOOD  URINALYSIS, ROUTINE W REFLEX MICROSCOPIC    Imaging Review No results found.   EKG Interpretation None      MDM   Final diagnoses:  None   [place],  Possible pud or mild pancreatitis.  Pt to follow up with pcp The chart was scribed for me under my direct supervision.  I personally performed the history, physical, and medical decision making and all procedures in the evaluation of this patient.Roberto Nelson.     Kriss Perleberg L Glynn Freas, MD 09/21/13 2142

## 2013-09-22 ENCOUNTER — Emergency Department (HOSPITAL_COMMUNITY): Payer: Medicare HMO

## 2013-09-22 ENCOUNTER — Emergency Department (HOSPITAL_COMMUNITY)
Admission: EM | Admit: 2013-09-22 | Discharge: 2013-09-22 | Disposition: A | Discharge status: Home / Self Care | Payer: Medicare HMO | Attending: Emergency Medicine | Admitting: Emergency Medicine

## 2013-09-22 ENCOUNTER — Encounter (HOSPITAL_COMMUNITY): Payer: Self-pay | Admitting: Emergency Medicine

## 2013-09-22 DIAGNOSIS — I10 Essential (primary) hypertension: Secondary | ICD-10-CM | POA: Insufficient documentation

## 2013-09-22 DIAGNOSIS — Z87891 Personal history of nicotine dependence: Secondary | ICD-10-CM | POA: Insufficient documentation

## 2013-09-22 DIAGNOSIS — K2970 Gastritis, unspecified, without bleeding: Secondary | ICD-10-CM | POA: Insufficient documentation

## 2013-09-22 DIAGNOSIS — IMO0002 Reserved for concepts with insufficient information to code with codable children: Secondary | ICD-10-CM | POA: Insufficient documentation

## 2013-09-22 DIAGNOSIS — Z880 Allergy status to penicillin: Secondary | ICD-10-CM | POA: Insufficient documentation

## 2013-09-22 DIAGNOSIS — Z9861 Coronary angioplasty status: Secondary | ICD-10-CM | POA: Insufficient documentation

## 2013-09-22 DIAGNOSIS — J441 Chronic obstructive pulmonary disease with (acute) exacerbation: Secondary | ICD-10-CM

## 2013-09-22 DIAGNOSIS — Z9889 Other specified postprocedural states: Secondary | ICD-10-CM | POA: Insufficient documentation

## 2013-09-22 DIAGNOSIS — Z862 Personal history of diseases of the blood and blood-forming organs and certain disorders involving the immune mechanism: Secondary | ICD-10-CM | POA: Insufficient documentation

## 2013-09-22 DIAGNOSIS — K2990 Gastroduodenitis, unspecified, without bleeding: Secondary | ICD-10-CM

## 2013-09-22 DIAGNOSIS — I2510 Atherosclerotic heart disease of native coronary artery without angina pectoris: Secondary | ICD-10-CM | POA: Insufficient documentation

## 2013-09-22 DIAGNOSIS — R011 Cardiac murmur, unspecified: Secondary | ICD-10-CM | POA: Insufficient documentation

## 2013-09-22 DIAGNOSIS — Z79899 Other long term (current) drug therapy: Secondary | ICD-10-CM | POA: Insufficient documentation

## 2013-09-22 DIAGNOSIS — Z791 Long term (current) use of non-steroidal anti-inflammatories (NSAID): Secondary | ICD-10-CM | POA: Insufficient documentation

## 2013-09-22 LAB — BASIC METABOLIC PANEL
Anion gap: 7 (ref 5–15)
BUN: 56 mg/dL — AB (ref 6–23)
CO2: 42 mEq/L (ref 19–32)
Calcium: 8.8 mg/dL (ref 8.4–10.5)
Chloride: 92 mEq/L — ABNORMAL LOW (ref 96–112)
Creatinine, Ser: 2.32 mg/dL — ABNORMAL HIGH (ref 0.50–1.35)
GFR calc Af Amer: 29 mL/min — ABNORMAL LOW (ref >90)
GFR calc non Af Amer: 25 mL/min — ABNORMAL LOW (ref >90)
Glucose, Bld: 106 mg/dL — ABNORMAL HIGH (ref 70–99)
POTASSIUM: 4.5 meq/L (ref 3.7–5.3)
Sodium: 141 mEq/L (ref 137–147)

## 2013-09-22 LAB — CBC WITH DIFFERENTIAL/PLATELET
Basophils Absolute: 0 10*3/uL (ref 0.0–0.1)
Basophils Relative: 0 % (ref 0–1)
EOS ABS: 0.4 10*3/uL (ref 0.0–0.7)
Eosinophils Relative: 5 % (ref 0–5)
HCT: 34.6 % — ABNORMAL LOW (ref 39.0–52.0)
Hemoglobin: 11.4 g/dL — ABNORMAL LOW (ref 13.0–17.0)
Lymphocytes Relative: 24 % (ref 12–46)
Lymphs Abs: 1.7 10*3/uL (ref 0.7–4.0)
MCH: 33.3 pg (ref 26.0–34.0)
MCHC: 32.9 g/dL (ref 30.0–36.0)
MCV: 101.2 fL — ABNORMAL HIGH (ref 78.0–100.0)
MONOS PCT: 8 % (ref 3–12)
Monocytes Absolute: 0.6 10*3/uL (ref 0.1–1.0)
Neutro Abs: 4.4 10*3/uL (ref 1.7–7.7)
Neutrophils Relative %: 62 % (ref 43–77)
Platelets: 119 10*3/uL — ABNORMAL LOW (ref 150–400)
RBC: 3.42 MIL/uL — ABNORMAL LOW (ref 4.22–5.81)
RDW: 12.7 % (ref 11.5–15.5)
WBC: 7.1 10*3/uL (ref 4.0–10.5)

## 2013-09-22 MED ORDER — ALBUTEROL SULFATE (2.5 MG/3ML) 0.083% IN NEBU: 2.5000 mg | INHALATION_SOLUTION | Freq: Once | RESPIRATORY_TRACT | Status: DC

## 2013-09-22 MED ORDER — PREDNISONE 50 MG PO TABS: ORAL_TABLET | ORAL | Status: DC

## 2013-09-22 MED ORDER — ALBUTEROL SULFATE (2.5 MG/3ML) 0.083% IN NEBU
2.5000 mg | INHALATION_SOLUTION | Freq: Once | RESPIRATORY_TRACT | Status: AC
  Administered 2013-09-22: 2.5 mg via RESPIRATORY_TRACT
  Filled 2013-09-22: qty 3

## 2013-09-22 MED ORDER — IPRATROPIUM-ALBUTEROL 0.5-2.5 (3) MG/3ML IN SOLN
3.0000 mL | Freq: Once | RESPIRATORY_TRACT | Status: AC
  Administered 2013-09-22: 3 mL via RESPIRATORY_TRACT
  Filled 2013-09-22: qty 3

## 2013-09-22 MED ORDER — KETOROLAC TROMETHAMINE 30 MG/ML IJ SOLN
30.0000 mg | Freq: Once | INTRAMUSCULAR | Status: AC
  Administered 2013-09-22: 30 mg via INTRAVENOUS
  Filled 2013-09-22: qty 1

## 2013-09-22 MED ORDER — DOXYCYCLINE HYCLATE 100 MG PO CAPS: 100.0000 mg | ORAL_CAPSULE | Freq: Two times a day (BID) | ORAL | Status: DC

## 2013-09-22 MED ORDER — SODIUM CHLORIDE 0.9 % IV BOLUS (SEPSIS)
500.0000 mL | Freq: Once | INTRAVENOUS | Status: AC
  Administered 2013-09-22: 500 mL via INTRAVENOUS

## 2013-09-22 MED ORDER — METHYLPREDNISOLONE SODIUM SUCC 125 MG IJ SOLR
125.0000 mg | Freq: Once | INTRAMUSCULAR | Status: AC
  Administered 2013-09-22: 125 mg via INTRAVENOUS
  Filled 2013-09-22: qty 2

## 2013-09-22 NOTE — ED Notes (Signed)
Dr. Adriana Simasook made aware of c/o R foot pai.

## 2013-09-22 NOTE — ED Notes (Signed)
Patient provided w/graham crackers, PB and ginger ale

## 2013-09-22 NOTE — ED Notes (Signed)
Patient seen in ER last night for c/o nausea, vomiting and abdominal pain.

## 2013-09-22 NOTE — Discharge Instructions (Signed)
Chest x-ray showed no pneumonia. Prescriptions for antibiotic and prednisone. No smoking. Followup your Dr.  Use your breathing machine at home

## 2013-09-22 NOTE — ED Notes (Signed)
CRITICAL VALUE ALERT  Critical value received:  CO2 42  Date of notification:  09/22/2013 Time of notification:  1322  Critical value read back:Yes.    Nurse who received alert:  lrt  MD notified (1st page):  Adriana Simasook Time of first page:1322  MD notified (2nd page):  Time of second page:  Responding MD: cook  Time MD responded:  1322

## 2013-09-22 NOTE — ED Notes (Signed)
PT c/o shortness of breath worsening since yesterday with nausea and weakness. PT states he has a dry cough.

## 2013-09-27 NOTE — ED Provider Notes (Signed)
CSN: [id number]     Arrival date & time 09/22/13  1207 History   First MD Initiated Contact with Patient 09/22/13 1235     Chief Complaint  Patient presents with  . Shortness of Breath     (Consider location/radiation/quality/duration/timing/severity/associated sxs/prior Treatment) HPI.... coughing, wheezing, shortness of breath for 24 hours. Patient has COPD and emphysema and is on home oxygen.  No chest pain, fever, chills, rusty sputum. Severity is mild to moderate.  Past Medical History  Diagnosis Date  . Edema   . Atherosclerosis   . Tobacco user   . Heart murmur   . Anginal pain   . Hypertension     [place]  [place]   . Shortness of breath     WITH EXERTION   . Cough   . CORONARY ATHEROSCLEROSIS NATIVE CORONARY ARTERY 11/03/2009    Qualifier: Diagnosis of  By: Via LPN, Larita FifeLynn    . COPD 11/03/2009    Qualifier: Diagnosis of  By: Via LPN, Larita FifeLynn    . TOBACCO ABUSE 11/03/2009    Qualifier: Diagnosis of  By: Daleen SquibbWall, MD, Lorenza CambridgeFACC, Thomas C   . Incarcerated inguinal hernia, unilateral 02/14/2012    Status post repair, per Dr. Abbey Chattersosenbower  . Right bundle branch block and left anterior fascicular block 12/30/2011  . Anemia   . Antral ulcer 02/25/2012  . Gastritis and duodenitis 02/25/2012    H.Pylori s/p Pylera   Past Surgical History  Procedure Laterality Date  . Colonoscopy      IN [place] AROUND 2008, NEGATIVE PER PATIENT  . Cardiac catheterization      2005 [place]   . Coronary angioplasty    . Inguinal hernia repair  02/14/2012    Procedure: HERNIA REPAIR INGUINAL ADULT;  Surgeon: Adolph Pollackodd J Rosenbower, MD;  [facility];  Service: General;  Laterality: Right;  . Insertion of mesh  02/14/2012    Procedure: INSERTION OF MESH;  Surgeon: Adolph Pollackodd J Rosenbower, MD;  [facility];  Service: General;  Laterality: Right;  . Esophagogastroduodenoscopy  02/25/2012    JXB:JYNWGSLF:Small hiatal hernia/Large non-bleeding ulcer was found in the gastric antrum/MILD Duodenal inflammation.  H.Pylori gastritis on bx  . Esophagogastroduodenoscopy N/A 07/21/2012    Procedure: ESOPHAGOGASTRODUODENOSCOPY (EGD);  Surgeon: West BaliSandi L Fields, MD;  Location: [facility];  Service: Endoscopy;  Laterality: N/A;  8:30   Family History  Problem Relation Age of Onset  . Colon cancer Neg Hx   . Liver disease Neg Hx    History  Substance Use Topics  . Smoking status: Former Smoker -- 0.50 packs/day for 50 years    Types: Cigarettes    Quit date: 11/13/2012  . Smokeless tobacco: Not on file  . Alcohol Use: Yes     Comment: BEER/cocktail socially. No heavy use per patient.     Review of Systems  All other systems reviewed and are negative.     Allergies  Penicillins  Home Medications   Prior to Admission medications   Medication Sig Start Date End Date Taking? Authorizing Provider  albuterol (PROAIR HFA) 108 (90 BASE) MCG/ACT inhaler Inhale 2 puffs into the lungs every 6 (six) hours as needed for wheezing or shortness of breath.   Yes Historical Provider, MD  ALPRAZolam (XANAX) 0.25 MG tablet Take 0.25 mg by mouth every 6 (six) hours as needed for anxiety.   Yes Historical Provider, MD  atorvastatin (LIPITOR) 40 MG tablet Take 40 mg by mouth daily.   Yes Historical Provider, MD  budesonide-formoterol (SYMBICORT) 160-4.5 MCG/ACT inhaler Take 2 puffs first thing in am and then another 2 puffs about 12 hours later. 03/19/13  Yes Nyoka Cowden, MD  furosemide (LASIX) 40 MG tablet Take 40-80 mg by mouth 2 (two) times daily. 2 tablets in the morning and 1 tablet at 2pm 09/06/12  Yes Gaylord Shih, MD  gabapentin (NEURONTIN) 100 MG capsule Take 100 mg by mouth 3 (three) times daily. 09/21/13  Yes Historical Provider, MD  losartan (COZAAR) 100 MG tablet Take 100 mg by mouth daily.   Yes Historical Provider, MD  meloxicam (MOBIC) 7.5 MG tablet Take 7.5 mg by mouth 2 (two) times daily as needed for pain.   Yes Historical Provider, MD  metoprolol (TOPROL-XL) 50 MG 24 hr tablet Take 50 mg by mouth  daily.     Yes Historical Provider, MD  ondansetron (ZOFRAN-ODT) 4 MG disintegrating tablet Take 4 mg by mouth every 8 (eight) hours as needed for nausea. 09/21/13  Yes [name], MD  pantoprazole (PROTONIX) 40 MG tablet Take 1 tablet (40 mg total) by mouth 2 (two) times daily before a meal. For treatment of ulcer. 02/26/12  Yes Elliot Cousin, MD  predniSONE (STERAPRED UNI-PAK) 10 MG tablet Take 1 tablet by mouth daily. Tapered dosepak 09/17/13  Yes Historical Provider, MD  tiotropium (SPIRIVA) 18 MCG inhalation capsule Place 18 mcg into inhaler and inhale daily.   Yes Historical Provider, MD  traMADol (ULTRAM) 50 MG tablet Take 50 mg by mouth every 6 (six) hours. 09/19/13  Yes Historical Provider, MD  doxycycline (VIBRAMYCIN) 100 MG capsule Take 1 capsule (100 mg total) by mouth 2 (two) times daily. 09/22/13   Donnetta Hutching, MD  predniSONE (DELTASONE) 50 MG tablet 1 tablet for 5 days, one half tablet for 5 days 09/22/13   Donnetta Hutching, MD   BP 110/48  Pulse 68  Temp(Src) 98 F (36.7 C) (Oral)  SpO2 99% Physical Exam  Nursing note and vitals reviewed. Constitutional: He is oriented to person, place, and time. He appears well-developed and well-nourished.  HENT:  Head: Normocephalic and atraumatic.  Eyes: Conjunctivae and EOM are normal. Pupils are equal, round, and reactive to light.  Neck: Normal range of motion. Neck supple.  Cardiovascular: Normal rate, regular rhythm and normal heart sounds.   Pulmonary/Chest: Effort normal.  Bilateral expiratory wheeze  Abdominal: Soft. Bowel sounds are normal.  Musculoskeletal: Normal range of motion.  Neurological: He is alert and oriented to person, place, and time.  Skin: Skin is warm and dry.  Psychiatric: He has a normal mood and affect. His behavior is normal.    ED Course  Procedures (including critical care time) Labs Review Labs Reviewed  BASIC METABOLIC PANEL - Abnormal; Notable for the following:    Chloride 92 (*)    CO2 42 (*)     Glucose, Bld 106 (*)    BUN 56 (*)    Creatinine, Ser 2.32 (*)    GFR calc non Af Amer 25 (*)    GFR calc Af Amer 29 (*)    All other components within normal limits  CBC WITH DIFFERENTIAL - Abnormal; Notable for the following:    RBC 3.42 (*)    Hemoglobin 11.4 (*)    HCT 34.6 (*)    MCV 101.2 (*)    Platelets 119 (*)    All other components within normal limits    Imaging Review No results found.   EKG Interpretation None  MDM   Final diagnoses:  COPD exacerbation    Patient feels much better after nebulizer treatments x2 with albuterol/Atrovent. IV steroids administered. Chest x-ray negative for pneumonia. Discharge medications doxycycline and prednisone. Patient feels much better at discharge.    Donnetta Hutching, MD 09/27/13 [phone]

## 2014-01-23 ENCOUNTER — Inpatient Hospital Stay (HOSPITAL_COMMUNITY)
Admission: EM | Admit: 2014-01-23 | Discharge: 2014-01-25 | DRG: 193 | Disposition: A | Discharge status: Home Health | Payer: Medicare HMO | Attending: Family Medicine | Admitting: Family Medicine

## 2014-01-23 ENCOUNTER — Emergency Department (HOSPITAL_COMMUNITY): Payer: Medicare HMO

## 2014-01-23 ENCOUNTER — Encounter (HOSPITAL_COMMUNITY): Payer: Self-pay | Admitting: Emergency Medicine

## 2014-01-23 DIAGNOSIS — I2510 Atherosclerotic heart disease of native coronary artery without angina pectoris: Secondary | ICD-10-CM | POA: Diagnosis present

## 2014-01-23 DIAGNOSIS — E785 Hyperlipidemia, unspecified: Secondary | ICD-10-CM | POA: Diagnosis present

## 2014-01-23 DIAGNOSIS — J189 Pneumonia, unspecified organism: Secondary | ICD-10-CM

## 2014-01-23 DIAGNOSIS — Z7952 Long term (current) use of systemic steroids: Secondary | ICD-10-CM

## 2014-01-23 DIAGNOSIS — Z79899 Other long term (current) drug therapy: Secondary | ICD-10-CM

## 2014-01-23 DIAGNOSIS — I5033 Acute on chronic diastolic (congestive) heart failure: Secondary | ICD-10-CM | POA: Diagnosis present

## 2014-01-23 DIAGNOSIS — K219 Gastro-esophageal reflux disease without esophagitis: Secondary | ICD-10-CM | POA: Diagnosis present

## 2014-01-23 DIAGNOSIS — J441 Chronic obstructive pulmonary disease with (acute) exacerbation: Secondary | ICD-10-CM | POA: Diagnosis present

## 2014-01-23 DIAGNOSIS — N184 Chronic kidney disease, stage 4 (severe): Secondary | ICD-10-CM

## 2014-01-23 DIAGNOSIS — Z87891 Personal history of nicotine dependence: Secondary | ICD-10-CM | POA: Diagnosis not present

## 2014-01-23 DIAGNOSIS — I129 Hypertensive chronic kidney disease with stage 1 through stage 4 chronic kidney disease, or unspecified chronic kidney disease: Secondary | ICD-10-CM | POA: Diagnosis present

## 2014-01-23 DIAGNOSIS — I5031 Acute diastolic (congestive) heart failure: Secondary | ICD-10-CM | POA: Insufficient documentation

## 2014-01-23 DIAGNOSIS — I509 Heart failure, unspecified: Secondary | ICD-10-CM

## 2014-01-23 DIAGNOSIS — N189 Chronic kidney disease, unspecified: Secondary | ICD-10-CM

## 2014-01-23 DIAGNOSIS — J440 Chronic obstructive pulmonary disease with acute lower respiratory infection: Secondary | ICD-10-CM | POA: Diagnosis present

## 2014-01-23 DIAGNOSIS — R0602 Shortness of breath: Secondary | ICD-10-CM

## 2014-01-23 DIAGNOSIS — D638 Anemia in other chronic diseases classified elsewhere: Secondary | ICD-10-CM | POA: Insufficient documentation

## 2014-01-23 DIAGNOSIS — J9611 Chronic respiratory failure with hypoxia: Secondary | ICD-10-CM | POA: Insufficient documentation

## 2014-01-23 DIAGNOSIS — R600 Localized edema: Secondary | ICD-10-CM

## 2014-01-23 DIAGNOSIS — J9621 Acute and chronic respiratory failure with hypoxia: Secondary | ICD-10-CM

## 2014-01-23 LAB — TROPONIN I

## 2014-01-23 LAB — CBC WITH DIFFERENTIAL/PLATELET
Basophils Absolute: 0 10*3/uL (ref 0.0–0.1)
Basophils Relative: 0 % (ref 0–1)
EOS ABS: 0.1 10*3/uL (ref 0.0–0.7)
EOS PCT: 1 % (ref 0–5)
HCT: 29.2 % — ABNORMAL LOW (ref 39.0–52.0)
Hemoglobin: 9.9 g/dL — ABNORMAL LOW (ref 13.0–17.0)
LYMPHS ABS: 1.1 10*3/uL (ref 0.7–4.0)
Lymphocytes Relative: 16 % (ref 12–46)
MCH: 33.1 pg (ref 26.0–34.0)
MCHC: 33.9 g/dL (ref 30.0–36.0)
MCV: 97.7 fL (ref 78.0–100.0)
MONO ABS: 0.5 10*3/uL (ref 0.1–1.0)
Monocytes Relative: 7 % (ref 3–12)
Neutro Abs: 5.3 10*3/uL (ref 1.7–7.7)
Neutrophils Relative %: 76 % (ref 43–77)
PLATELETS: 177 10*3/uL (ref 150–400)
RBC: 2.99 MIL/uL — AB (ref 4.22–5.81)
RDW: 13.7 % (ref 11.5–15.5)
WBC: 6.9 10*3/uL (ref 4.0–10.5)

## 2014-01-23 LAB — BASIC METABOLIC PANEL
Anion gap: 13 (ref 5–15)
BUN: 61 mg/dL — AB (ref 6–23)
CALCIUM: 9.5 mg/dL (ref 8.4–10.5)
CO2: 34 mEq/L — ABNORMAL HIGH (ref 19–32)
CREATININE: 2.6 mg/dL — AB (ref 0.50–1.35)
Chloride: 97 mEq/L (ref 96–112)
GFR calc Af Amer: 26 mL/min — ABNORMAL LOW (ref >90)
GFR, EST NON AFRICAN AMERICAN: 22 mL/min — AB (ref >90)
GLUCOSE: 118 mg/dL — AB (ref 70–99)
Potassium: 4.4 mEq/L (ref 3.7–5.3)
Sodium: 144 mEq/L (ref 137–147)

## 2014-01-23 LAB — POC OCCULT BLOOD, ED: Fecal Occult Bld: NEGATIVE

## 2014-01-23 LAB — I-STAT TROPONIN, ED: Troponin i, poc: 0.09 ng/mL (ref 0.00–0.08)

## 2014-01-23 LAB — PRO B NATRIURETIC PEPTIDE: Pro B Natriuretic peptide (BNP): 7954 pg/mL — ABNORMAL HIGH (ref 0–450)

## 2014-01-23 LAB — PROCALCITONIN: Procalcitonin: <0.1 ng/mL

## 2014-01-23 LAB — MAGNESIUM: MAGNESIUM: 2.3 mg/dL (ref 1.5–2.5)

## 2014-01-23 LAB — RETICULOCYTES
RBC.: 3.16 MIL/uL — ABNORMAL LOW (ref 4.22–5.81)
RETIC COUNT ABSOLUTE: 41.1 10*3/uL (ref 19.0–186.0)
RETIC CT PCT: 1.3 % (ref 0.4–3.1)

## 2014-01-23 MED ORDER — ACETAMINOPHEN 325 MG PO TABS: 650.0000 mg | ORAL_TABLET | Freq: Four times a day (QID) | ORAL | Status: DC | PRN

## 2014-01-23 MED ORDER — ALBUTEROL SULFATE (2.5 MG/3ML) 0.083% IN NEBU
2.5000 mg | INHALATION_SOLUTION | Freq: Once | RESPIRATORY_TRACT | Status: AC
  Administered 2014-01-23: 2.5 mg via RESPIRATORY_TRACT
  Filled 2014-01-23: qty 3

## 2014-01-23 MED ORDER — LOSARTAN POTASSIUM 50 MG PO TABS
100.0000 mg | ORAL_TABLET | Freq: Every day | ORAL | Status: DC
  Administered 2014-01-24: 100 mg via ORAL
  Filled 2014-01-23: qty 2

## 2014-01-23 MED ORDER — PANTOPRAZOLE SODIUM 40 MG PO TBEC
40.0000 mg | DELAYED_RELEASE_TABLET | Freq: Two times a day (BID) | ORAL | Status: DC
  Administered 2014-01-23 – 2014-01-25 (×4): 40 mg via ORAL
  Filled 2014-01-23 (×4): qty 1

## 2014-01-23 MED ORDER — ATORVASTATIN CALCIUM 40 MG PO TABS
40.0000 mg | ORAL_TABLET | Freq: Every day | ORAL | Status: DC
  Administered 2014-01-23 – 2014-01-24 (×2): 40 mg via ORAL
  Filled 2014-01-23 (×2): qty 1

## 2014-01-23 MED ORDER — SENNA 8.6 MG PO TABS
1.0000 | ORAL_TABLET | Freq: Two times a day (BID) | ORAL | Status: DC
  Administered 2014-01-23 – 2014-01-25 (×4): 8.6 mg via ORAL
  Filled 2014-01-23 (×4): qty 1

## 2014-01-23 MED ORDER — HEPARIN SODIUM (PORCINE) 5000 UNIT/ML IJ SOLN
5000.0000 [IU] | Freq: Three times a day (TID) | INTRAMUSCULAR | Status: DC
  Administered 2014-01-23 – 2014-01-25 (×6): 5000 [IU] via SUBCUTANEOUS
  Filled 2014-01-23 (×6): qty 1

## 2014-01-23 MED ORDER — OXYCODONE HCL 5 MG PO TABS
5.0000 mg | ORAL_TABLET | ORAL | Status: DC | PRN
Start: 2014-01-23 — End: 2014-01-25

## 2014-01-23 MED ORDER — ALPRAZOLAM 0.25 MG PO TABS
0.2500 mg | ORAL_TABLET | Freq: Four times a day (QID) | ORAL | Status: DC | PRN
  Administered 2014-01-23: 0.25 mg via ORAL
  Filled 2014-01-23 (×2): qty 1

## 2014-01-23 MED ORDER — ACETAMINOPHEN 650 MG RE SUPP: 650.0000 mg | Freq: Four times a day (QID) | RECTAL | Status: DC | PRN

## 2014-01-23 MED ORDER — LEVOFLOXACIN IN D5W 750 MG/150ML IV SOLN
750.0000 mg | Freq: Once | INTRAVENOUS | Status: AC
  Administered 2014-01-23: 750 mg via INTRAVENOUS
  Filled 2014-01-23: qty 150

## 2014-01-23 MED ORDER — SODIUM CHLORIDE 0.9 % IV SOLN: 250.0000 mL | INTRAVENOUS | Status: DC | PRN

## 2014-01-23 MED ORDER — METHYLPREDNISOLONE SODIUM SUCC 125 MG IJ SOLR
125.0000 mg | Freq: Once | INTRAMUSCULAR | Status: DC
  Filled 2014-01-23: qty 2

## 2014-01-23 MED ORDER — METHYLPREDNISOLONE SODIUM SUCC 125 MG IJ SOLR
125.0000 mg | Freq: Once | INTRAMUSCULAR | Status: AC
  Administered 2014-01-23: 125 mg via INTRAVENOUS
  Filled 2014-01-23: qty 2

## 2014-01-23 MED ORDER — ONDANSETRON HCL 4 MG PO TABS: 4.0000 mg | ORAL_TABLET | Freq: Four times a day (QID) | ORAL | Status: DC | PRN

## 2014-01-23 MED ORDER — POLYETHYLENE GLYCOL 3350 17 G PO PACK: 17.0000 g | PACK | Freq: Every day | ORAL | Status: DC | PRN

## 2014-01-23 MED ORDER — ONDANSETRON HCL 4 MG/2ML IJ SOLN: 4.0000 mg | Freq: Four times a day (QID) | INTRAMUSCULAR | Status: DC | PRN

## 2014-01-23 MED ORDER — FUROSEMIDE 10 MG/ML IJ SOLN
40.0000 mg | Freq: Two times a day (BID) | INTRAMUSCULAR | Status: DC
  Administered 2014-01-23 – 2014-01-24 (×3): 40 mg via INTRAVENOUS
  Filled 2014-01-23 (×3): qty 4

## 2014-01-23 MED ORDER — SODIUM CHLORIDE 0.9 % IJ SOLN
3.0000 mL | Freq: Two times a day (BID) | INTRAMUSCULAR | Status: DC
  Administered 2014-01-24: 3 mL via INTRAVENOUS

## 2014-01-23 MED ORDER — PREDNISONE 20 MG PO TABS
50.0000 mg | ORAL_TABLET | Freq: Every day | ORAL | Status: DC
  Administered 2014-01-24 – 2014-01-25 (×2): 50 mg via ORAL
  Filled 2014-01-23: qty 2
  Filled 2014-01-23 (×2): qty 1
  Filled 2014-01-23: qty 2

## 2014-01-23 MED ORDER — LEVOFLOXACIN IN D5W 500 MG/100ML IV SOLN: 500.0000 mg | INTRAVENOUS | Status: DC

## 2014-01-23 MED ORDER — METOPROLOL SUCCINATE ER 50 MG PO TB24
50.0000 mg | ORAL_TABLET | Freq: Every day | ORAL | Status: DC
  Administered 2014-01-24 – 2014-01-25 (×2): 50 mg via ORAL
  Filled 2014-01-23 (×2): qty 1

## 2014-01-23 MED ORDER — SODIUM CHLORIDE 0.9 % IJ SOLN: 3.0000 mL | INTRAMUSCULAR | Status: DC | PRN

## 2014-01-23 MED ORDER — SODIUM CHLORIDE 0.9 % IJ SOLN
3.0000 mL | Freq: Two times a day (BID) | INTRAMUSCULAR | Status: DC
  Administered 2014-01-23 – 2014-01-25 (×3): 3 mL via INTRAVENOUS

## 2014-01-23 MED ORDER — IPRATROPIUM-ALBUTEROL 0.5-2.5 (3) MG/3ML IN SOLN
3.0000 mL | RESPIRATORY_TRACT | Status: DC
  Administered 2014-01-23 – 2014-01-25 (×12): 3 mL via RESPIRATORY_TRACT
  Filled 2014-01-23 (×12): qty 3

## 2014-01-23 NOTE — H&P (Signed)
[facility] History and Physical  Roberto Nelson ZOX:[id number] DOB: Jul 29, 1933 DOA: 01/23/2014  Referring physician: Dr. Roselyn Bering PCP: Catalina Pizza, MD   Chief Complaint: SOB  HPI: Roberto Nelson is a 79 y.o. male  Worsening SOB over the past week. Baseline O2 of 3L at home. Unsure of what may have triggered episode other than change in weather. Albuterol w/ some improvement. Sleeps in a recliner at baseline. Also noted to have increasing LE edema. One week ago PCP increased his fluid pills but pt states he is unsure of his dose. He states that he takes his lasix as needed to "feel better."   Review of Systems:  Constitutional:  No weight loss, night sweats, Fevers, chills, HEENT:  No headaches, Difficulty swallowing,Tooth/dental problems,Sore throat,  No sneezing, itching, ear ache, nasal congestion, post nasal drip,  Cardio-vascular:  No chest pain,  dizziness, palpitations endorseses LE swelling  GI:  No heartburn, indigestion, abdominal pain, nausea, vomiting, diarrhea, change in bowel habits, loss of appetite  Resp:  Per HPI Skin:  no rash or lesions.  GU:  no dysuria, change in color of urine, no urgency or frequency. No flank pain.  Musculoskeletal:  No joint pain or swelling. No decreased range of motion. No back pain.  Psych:  No change in mood or affect. No depression or anxiety. No memory loss.   Past Medical History  Diagnosis Date  . Edema   . Atherosclerosis   . Tobacco user   . Heart murmur   . Anginal pain   . Hypertension     [place]  [place]   . Shortness of breath     WITH EXERTION   . Cough   . CORONARY ATHEROSCLEROSIS NATIVE CORONARY ARTERY 11/03/2009    Qualifier: Diagnosis of  By: Via LPN, Larita Fife    . COPD 11/03/2009    Qualifier: Diagnosis of  By: Via LPN, Larita Fife    . TOBACCO ABUSE 11/03/2009    Qualifier: Diagnosis of  By: Daleen Squibb, MD, Lorenza Cambridge Incarcerated inguinal hernia, unilateral 02/14/2012    Status post repair,  per Dr. Abbey Chatters  . Right bundle branch block and left anterior fascicular block 12/30/2011  . Anemia   . Antral ulcer 02/25/2012  . Gastritis and duodenitis 02/25/2012    H.Pylori s/p Pylera   Past Surgical History  Procedure Laterality Date  . Colonoscopy      IN [place] AROUND [place], NEGATIVE PER PATIENT  . Cardiac catheterization      2005 [place]   . Coronary angioplasty    . Inguinal hernia repair  02/14/2012    Procedure: HERNIA REPAIR INGUINAL ADULT;  Surgeon: Adolph Pollack, MD;  Location: [facility] OR;  Service: General;  Laterality: Right;  . Insertion of mesh  02/14/2012    Procedure: INSERTION OF MESH;  Surgeon: Adolph Pollack, MD;  [facility]: [facility] OR;  Service: General;  Laterality: Right;  . Esophagogastroduodenoscopy  02/25/2012    WJX:BJYNW hiatal hernia/Large non-bleeding ulcer was found in the gastric antrum/MILD Duodenal inflammation. H.Pylori gastritis on bx  . Esophagogastroduodenoscopy N/A 07/21/2012    Procedure: ESOPHAGOGASTRODUODENOSCOPY (EGD);  Surgeon: [place], MD;  Location: [facility];  Service: Endoscopy;  Laterality: N/A;  8:30   Social History:  reports that he quit smoking about 14 months ago. His smoking use included Cigarettes. He has a 25 pack-year smoking history. He does not have any smokeless tobacco history on file. He reports that he  drinks alcohol. He reports that he does not use illicit drugs.  Allergies  Allergen Reactions  . Penicillins Itching    Family History  Problem Relation Age of Onset  . Colon cancer Neg Hx   . Liver disease Neg Hx      Prior to Admission medications   Medication Sig Start Date End Date Taking? Authorizing Provider  albuterol (PROAIR HFA) 108 (90 BASE) MCG/ACT inhaler Inhale 2 puffs into the lungs every 6 (six) hours as needed for wheezing or shortness of breath.   Yes Historical Provider, MD  ALPRAZolam (XANAX) 0.25 MG tablet Take 0.25 mg by mouth every 6 (six) hours as needed for anxiety.   Yes  Historical Provider, MD  atorvastatin (LIPITOR) 40 MG tablet Take 40 mg by mouth daily.   Yes Historical Provider, MD  budesonide-formoterol (SYMBICORT) 160-4.5 MCG/ACT inhaler Take 2 puffs first thing in am and then another 2 puffs about 12 hours later. 03/19/13  Yes Nyoka CowdenMichael B Wert, MD  furosemide (LASIX) 40 MG tablet Take 40 mg by mouth 2 (two) times daily. 2 tablets in the morning and 1 tablet at 2pm 09/06/12  Yes Gaylord Shihhomas C Wall, MD  losartan (COZAAR) 100 MG tablet Take 100 mg by mouth daily.   Yes Historical Provider, MD  meloxicam (MOBIC) 7.5 MG tablet Take 7.5 mg by mouth 2 (two) times daily as needed for pain.   Yes Historical Provider, MD  metoprolol (TOPROL-XL) 50 MG 24 hr tablet Take 50 mg by mouth daily.     Yes Historical Provider, MD  pantoprazole (PROTONIX) 40 MG tablet Take 1 tablet (40 mg total) by mouth 2 (two) times daily before a meal. For treatment of ulcer. 02/26/12  Yes Elliot Cousinenise Fisher, MD  tiotropium (SPIRIVA) 18 MCG inhalation capsule Place 18 mcg into inhaler and inhale daily.   Yes Historical Provider, MD  traMADol (ULTRAM) 50 MG tablet Take 50 mg by mouth every 6 (six) hours as needed for moderate pain.  09/19/13  Yes Historical Provider, MD  doxycycline (VIBRAMYCIN) 100 MG capsule Take 1 capsule (100 mg total) by mouth 2 (two) times daily. Patient not taking: Reported on 01/23/2014 09/22/13   Donnetta HutchingBrian Cook, MD  gabapentin (NEURONTIN) 100 MG capsule Take 100 mg by mouth 3 (three) times daily. 09/21/13   Historical Provider, MD  ondansetron (ZOFRAN-ODT) 4 MG disintegrating tablet Take 4 mg by mouth every 8 (eight) hours as needed for nausea. 09/21/13   Benny LennertJoseph L Zammit, MD  predniSONE (DELTASONE) 50 MG tablet 1 tablet for 5 days, one half tablet for 5 days Patient not taking: Reported on 01/23/2014 09/22/13   Donnetta HutchingBrian Cook, MD  predniSONE (STERAPRED UNI-PAK) 10 MG tablet Take 1 tablet by mouth daily. Tapered dosepak 09/17/13   Historical Provider, MD   Physical Exam: Filed Vitals:   01/23/14  1314 01/23/14 1354 01/23/14 1436 01/23/14 1632  BP: 141/52   176/78  Pulse: 75   74  Temp: 98 F (36.7 C)   98.6 F (37 C)  TempSrc: Oral     Resp:    20  Height: 5\' 7"  (1.702 m)     Weight: 93.895 kg (207 lb)     SpO2: 84% 100% 99% 94%    Wt Readings from Last 3 Encounters:  01/23/14 93.895 kg (207 lb)  09/21/13 83.915 kg (185 lb)  03/19/13 82.101 kg (181 lb)    General: Appears calm and comfortable Eyes: PERRL, normal lids, irises & conjunctiva ENT: grossly normal lips & tongue. Hard  of hearing Neck:  no LAD, masses or thyromegaly Cardiovascular: distant heart sounds, RRR, no m/r/g. 2-3+ pitting edema Telemetry: SR, no arrhythmias  Respiratory: Diffuse end expiratory wheezing. On [place] w/ O2 sats 94%.  Abdomen: soft, ntnd Skin:  no rash or induration seen on limited exam Musculoskeletal:  grossly normal tone BUE/BLE Psychiatric:  grossly normal mood and affect, speech fluent and appropriate Neurologic:  grossly non-focal.          Labs on Admission:  Basic Metabolic Panel:  Recent Labs Lab 01/23/14 1339  NA 144  K 4.4  CL 97  CO2 34*  GLUCOSE 118*  BUN 61*  CREATININE 2.60*  CALCIUM 9.5   Liver Function Tests: No results for input(s): AST, ALT, ALKPHOS, BILITOT, PROT, ALBUMIN in the last 168 hours. No results for input(s): LIPASE, AMYLASE in the last 168 hours. No results for input(s): AMMONIA in the last 168 hours. CBC:  Recent Labs Lab 01/23/14 1339  WBC 6.9  NEUTROABS 5.3  HGB 9.9*  HCT 29.2*  MCV 97.7  PLT 177   Cardiac Enzymes:  Recent Labs Lab 01/23/14 1339  TROPONINI <0.30    BNP (last 3 results)  Recent Labs  01/23/14 1339  PROBNP 7954.0*   CBG: No results for input(s): GLUCAP in the last 168 hours.  Radiological Exams on Admission: Dg Chest 2 View  01/23/2014   CLINICAL DATA:  Shortness of breath.  EXAM: CHEST  2 VIEW  COMPARISON:  09/22/2013  FINDINGS: Borderline enlargement of the cardiopericardial silhouette emphysema.  Increased right infrahilar markings and a new linear opacities along the right hemidiaphragm. Tortuous thoracic aorta. Thoracic spondylosis. No pleural effusion identified. Tapering of the peripheral pulmonary vasculature favors emphysema.  IMPRESSION: 1. Borderline cardiomegaly, without edema. 2. Linear opacities compatible with subsegmental atelectasis or less likely early bronchopneumonia at the right lung base and right infrahilar region. 3. Emphysema.   Electronically Signed   By: Herbie Baltimore M.D.   On: 01/23/2014 14:37    EKG: Independently reviewed. NSR, RBBB, no significant change from previous. No ACS  Assessment/Plan Active Problems:   Bilateral lower extremity edema   COPD exacerbation   CAP (community acquired pneumonia)   Acute exacerbation of congestive heart failure   Chronic kidney disease (CKD), stage IV (severe)   CAP: likely community acquired pnemonia though lung findings may be from CHF exacerbation and COPD exacerbation. Increased O2 requirement CXR concerning for RLL pneumonia.  - O2 PRN - procalcitonin - Levaquin - tylenol PRN - BCX, legionella, sputum culture, strep pneumo Ag per protocol  CKD IV: Cr 2.6 on admission. Baseline ~ 2.5 per previous ED visit this year. Other labs from 2013 nml. No Neprhologist. Anticipate worsening fdue to diuresis - BMET in am - consider Renal consult as inpt or at least having pt f/u outpt.   Diastolic CHF exacerbation: BNP 7954 (some skewing likely due to Cr 2.6). 01/06/14 EF 55-60%, and grade I diastolic dysfunction. Only taking Lasix PRN. Wt up 22lbs from previous ED visit 4 mo ago. 3+ pitting edema - Strict I/O - daily wts - Echo w/ contrast (no renal impairment) -  Continue Losartan adn metoprolol  COPD exacerbation: worsened by CAP.  - Solumedrol 125 x1 - prednisone 50mg  Qam x4 doses - duonebs Q4  Anemia: Hgb 9.9. Baseline 11.5. Fecal occult Neg. Likely from chronic disease - CBC in am - Anemia panel  GERD:    - continue protonix  HLD: - continue lipitor  Code Status: FULL DVT Prophylaxis:  Hep Family Communication: Best friend at baseline - No family per pt.  Disposition Plan: pending improvement  Time spent: >70 min in direct pt care adn coordination  Dimitry Holsworth J [facility] [email or web address] Password [email or web address]

## 2014-01-23 NOTE — ED Provider Notes (Signed)
CSN: [id number]     Arrival date & time 01/23/14  1237 History  This chart was scribed for Linwood DibblesJon Daisy Mcneel, MD by Richarda Overlieichard Holland, ED Scribe. This patient was seen in room APA01/APA01 and the patient's care was started 1:27 PM.    Chief Complaint  Patient presents with  . Shortness of Breath   The history is provided by the patient. No language interpreter was used.   HPI Comments: Roberto Nelson is a 80 y.o. male with a history of COPD and HTN who presents to the Emergency Department complaining of gradually worsening SOB that started a couple of days ago. He reports he feels SOB when he lies flat. Pt states he sometimes has to use inhalers to help his breathing. He states he has used his inhaler this past week but has only experienced mild relief. Pt says he talked to his PCP a week ago and was feeling normal at that time. Patient says he quit smoking a long time ago.   PCP HALL   Past Medical History  Diagnosis Date  . Edema   . Atherosclerosis   . Tobacco user   . Heart murmur   . Anginal pain   . Hypertension     [place]  [place]   . Shortness of breath     WITH EXERTION   . Cough   . CORONARY ATHEROSCLEROSIS NATIVE CORONARY ARTERY 11/03/2009    Qualifier: Diagnosis of  By: Via LPN, Larita FifeLynn    . COPD 11/03/2009    Qualifier: Diagnosis of  By: Via LPN, Larita FifeLynn    . TOBACCO ABUSE 11/03/2009    Qualifier: Diagnosis of  By: Daleen SquibbWall, MD, Lorenza CambridgeFACC, Thomas C   . Incarcerated inguinal hernia, unilateral 02/14/2012    Status post repair, per Dr. Abbey Chattersosenbower  . Right bundle branch block and left anterior fascicular block 12/30/2011  . Anemia   . Antral ulcer 02/25/2012  . Gastritis and duodenitis 02/25/2012    H.Pylori s/p Pylera   Past Surgical History  Procedure Laterality Date  . Colonoscopy      IN [place] AROUND 2008, NEGATIVE PER PATIENT  . Cardiac catheterization      2005 IN [place]   . Coronary angioplasty    . Inguinal hernia repair  02/14/2012    Procedure: HERNIA REPAIR  INGUINAL ADULT;  Surgeon: Adolph Pollackodd J Rosenbower, MD;  [facility];  Service: General;  Laterality: Right;  . Insertion of mesh  02/14/2012    Procedure: INSERTION OF MESH;  Surgeon: Adolph Pollackodd J Rosenbower, MD;  [facility];  Service: General;  Laterality: Right;  . Esophagogastroduodenoscopy  02/25/2012    WUJ:WJXBJSLF:Small hiatal hernia/Large non-bleeding ulcer was found in the gastric antrum/MILD Duodenal inflammation. H.Pylori gastritis on bx  . Esophagogastroduodenoscopy N/A 07/21/2012    Procedure: ESOPHAGOGASTRODUODENOSCOPY (EGD);  Surgeon: West BaliSandi L Fields, MD;  Location: [facility];  Service: Endoscopy;  Laterality: N/A;  8:30   Family History  Problem Relation Age of Onset  . Colon cancer Neg Hx   . Liver disease Neg Hx    History  Substance Use Topics  . Smoking status: Former Smoker -- 0.50 packs/day for 50 years    Types: Cigarettes    Quit date: 11/13/2012  . Smokeless tobacco: Not on file  . Alcohol Use: Yes     Comment: BEER/cocktail socially. No heavy use per patient.     Review of Systems  Respiratory: Positive for choking and shortness of breath.   All other systems  reviewed and are negative.     Allergies  Penicillins  Home Medications   Prior to Admission medications   Medication Sig Start Date End Date Taking? Authorizing Provider  albuterol (PROAIR HFA) 108 (90 BASE) MCG/ACT inhaler Inhale 2 puffs into the lungs every 6 (six) hours as needed for wheezing or shortness of breath.   Yes Historical Provider, MD  ALPRAZolam (XANAX) 0.25 MG tablet Take 0.25 mg by mouth every 6 (six) hours as needed for anxiety.    Historical Provider, MD  atorvastatin (LIPITOR) 40 MG tablet Take 40 mg by mouth daily.    Historical Provider, MD  budesonide-formoterol (SYMBICORT) 160-4.5 MCG/ACT inhaler Take 2 puffs first thing in am and then another 2 puffs about 12 hours later. 03/19/13   Nyoka Cowden, MD  doxycycline (VIBRAMYCIN) 100 MG capsule Take 1 capsule (100 mg total) by mouth 2  (two) times daily. 09/22/13   Donnetta Hutching, MD  furosemide (LASIX) 40 MG tablet Take 40-80 mg by mouth 2 (two) times daily. 2 tablets in the morning and 1 tablet at 2pm 09/06/12   Gaylord Shih, MD  gabapentin (NEURONTIN) 100 MG capsule Take 100 mg by mouth 3 (three) times daily. 09/21/13   Historical Provider, MD  losartan (COZAAR) 100 MG tablet Take 100 mg by mouth daily.    Historical Provider, MD  meloxicam (MOBIC) 7.5 MG tablet Take 7.5 mg by mouth 2 (two) times daily as needed for pain.    Historical Provider, MD  metoprolol (TOPROL-XL) 50 MG 24 hr tablet Take 50 mg by mouth daily.      Historical Provider, MD  ondansetron (ZOFRAN-ODT) 4 MG disintegrating tablet Take 4 mg by mouth every 8 (eight) hours as needed for nausea. 09/21/13   [name], MD  pantoprazole (PROTONIX) 40 MG tablet Take 1 tablet (40 mg total) by mouth 2 (two) times daily before a meal. For treatment of ulcer. 02/26/12   Elliot Cousin, MD  predniSONE (DELTASONE) 50 MG tablet 1 tablet for 5 days, one half tablet for 5 days 09/22/13   Donnetta Hutching, MD  predniSONE (STERAPRED UNI-PAK) 10 MG tablet Take 1 tablet by mouth daily. Tapered dosepak 09/17/13   Historical Provider, MD  tiotropium (SPIRIVA) 18 MCG inhalation capsule Place 18 mcg into inhaler and inhale daily.    Historical Provider, MD  traMADol (ULTRAM) 50 MG tablet Take 50 mg by mouth every 6 (six) hours. 09/19/13   Historical Provider, MD   BP 141/52 mmHg  Pulse 75  Temp(Src) 98 F (36.7 C) (Oral)  Ht 5\' 7"  (1.702 m)  Wt 207 lb (93.895 kg)  BMI 32.41 kg/m2  SpO2 99% Physical Exam  Constitutional: No distress.  HENT:  Head: Normocephalic and atraumatic.  Right Ear: External ear normal.  Left Ear: External ear normal.  Eyes: Conjunctivae are normal. Right eye exhibits no discharge. Left eye exhibits no discharge. No scleral icterus.  Neck: Neck supple. No tracheal deviation present.  Cardiovascular: Normal rate, regular rhythm and intact distal pulses.    Pulmonary/Chest: Effort normal. No stridor. No respiratory distress. He has wheezes. He has rales.  Bilateral bases with wheezes and rales, left greater than right, prolonged expiration  Abdominal: Soft. Bowel sounds are normal. He exhibits no distension. There is no tenderness. There is no rebound and no guarding.  Musculoskeletal: He exhibits edema. He exhibits no tenderness.  Bilateral le below knees  Neurological: He is alert. He has normal strength. No cranial nerve deficit (no facial droop,  extraocular movements intact, no slurred speech) or sensory deficit. He exhibits normal muscle tone. He displays no seizure activity. Coordination normal.  Skin: Skin is warm and dry. No rash noted. He is not diaphoretic.  Psychiatric: He has a normal mood and affect.  Nursing note and vitals reviewed.   ED Course  Procedures   DIAGNOSTIC STUDIES: Oxygen Saturation is 84% on [place], adequate by my interpretation.    COORDINATION OF CARE: 1:33 PM Discussed treatment plan with pt at bedside and pt agreed to plan.   Labs Review Labs Reviewed  CBC WITH DIFFERENTIAL - Abnormal; Notable for the following:    RBC 2.99 (*)    Hemoglobin 9.9 (*)    HCT 29.2 (*)    All other components within normal limits  BASIC METABOLIC PANEL - Abnormal; Notable for the following:    CO2 34 (*)    Glucose, Bld 118 (*)    BUN 61 (*)    Creatinine, Ser 2.60 (*)    GFR calc non Af Amer 22 (*)    GFR calc Af Amer 26 (*)    All other components within normal limits  PRO B NATRIURETIC PEPTIDE - Abnormal; Notable for the following:    Pro B Natriuretic peptide (BNP) 7954.0 (*)    All other components within normal limits  I-STAT TROPOININ, ED - Abnormal; Notable for the following:    Troponin i, poc 0.09 (*)    All other components within normal limits  TROPONIN I  POC OCCULT BLOOD, ED    Imaging Review Dg Chest 2 View  01/23/2014   CLINICAL DATA:  Shortness of breath.  EXAM: CHEST  2 VIEW  COMPARISON:   09/22/2013  FINDINGS: Borderline enlargement of the cardiopericardial silhouette emphysema. Increased right infrahilar markings and a new linear opacities along the right hemidiaphragm. Tortuous thoracic aorta. Thoracic spondylosis. No pleural effusion identified. Tapering of the peripheral pulmonary vasculature favors emphysema.  IMPRESSION: 1. Borderline cardiomegaly, without edema. 2. Linear opacities compatible with subsegmental atelectasis or less likely early bronchopneumonia at the right lung base and right infrahilar region. 3. Emphysema.   Electronically Signed   By: Herbie Baltimore M.D.   On: 01/23/2014 14:37     EKG Interpretation   Date/Time:  Wednesday January 23 2014 13:19:33 EST Ventricular Rate:  72 PR Interval:  169 QRS Duration: 150 QT Interval:  432 QTC Calculation: 473 R Axis:   -96 Text Interpretation:  Sinus rhythm RBBB and LAFB Baseline wander in  lead(s) V6 No significant change since last tracing Confirmed by Aneisa Karren   MD-J, Gavyn Zoss ([id number]) on 01/23/2014 1:26:15 PM     Medications  levofloxacin (LEVAQUIN) IVPB 750 mg (not administered)  methylPREDNISolone sodium succinate (SOLU-MEDROL) 125 mg/2 mL injection 125 mg (not administered)  albuterol (PROVENTIL) (2.5 MG/3ML) 0.083% nebulizer solution 2.5 mg (2.5 mg Nebulization Given 01/23/14 1433)    MDM   Final diagnoses:  CAP (community acquired pneumonia)  COPD exacerbation  Congestive heart failure, unspecified congestive heart failure chronicity, unspecified congestive heart failure type   Patient has history of COPD. He presented to the emergency room with complaints of worsening dyspnea. Chest x-ray shows possibility of an early pneumonia versus atelectasis. Exam primarily is having wheezing.  Chest x-ray does not show pulmonary edema but he may have a component of COPD and pulmonary edema. Slightly elevated troponin but no acute EKG changes. The patient also has worsening anemia. Stool will be tested for occult  blood.  Plan on admission for further  treatment.  I personally performed the services described in this documentation, which was scribed in my presence.  The recorded information has been reviewed and is accurate.      Linwood DibblesJon Whitney Hillegass, MD 01/23/14 [phone]

## 2014-01-23 NOTE — Progress Notes (Signed)
ANTIBIOTIC CONSULT NOTE - INITIAL  Pharmacy Consult for Levaquin Indication: pneumonia  Allergies  Allergen Reactions  . Penicillins Itching    Patient Measurements: Height: 5\' 7"  (170.2 cm) Weight: 171 lb 12.8 oz (77.928 kg) IBW/kg (Calculated) : 66.1 Adjusted Body Weight:   Vital Signs: Temp: 98.5 F (36.9 C) (11/11 1756) Temp Source: Oral (11/11 1756) BP: 153/62 mmHg (11/11 1756) Pulse Rate: 74 (11/11 1756) Intake/Output from previous day:   Intake/Output from this shift:    Labs:  Recent Labs  01/23/14 1339  WBC 6.9  HGB 9.9*  PLT 177  CREATININE 2.60*   Estimated Creatinine Clearance: 21.9 mL/min (by C-G formula based on Cr of 2.6). No results for input(s): VANCOTROUGH, VANCOPEAK, VANCORANDOM, GENTTROUGH, GENTPEAK, GENTRANDOM, TOBRATROUGH, TOBRAPEAK, TOBRARND, AMIKACINPEAK, AMIKACINTROU, AMIKACIN in the last 72 hours.   Microbiology: No results found for this or any previous visit (from the past 720 hour(s)).  Medical History: Past Medical History  Diagnosis Date  . Edema   . Atherosclerosis   . Tobacco user   . Heart murmur   . Anginal pain   . Hypertension     [place]  [place]   . Shortness of breath     WITH EXERTION   . Cough   . CORONARY ATHEROSCLEROSIS NATIVE CORONARY ARTERY 11/03/2009    Qualifier: Diagnosis of  By: Via LPN, Larita FifeLynn    . COPD 11/03/2009    Qualifier: Diagnosis of  By: Via LPN, Larita FifeLynn    . TOBACCO ABUSE 11/03/2009    Qualifier: Diagnosis of  By: Daleen SquibbWall, MD, Lorenza CambridgeFACC, Thomas C   . Incarcerated inguinal hernia, unilateral 02/14/2012    Status post repair, per Dr. Abbey Chattersosenbower  . Right bundle branch block and left anterior fascicular block 12/30/2011  . Anemia   . Antral ulcer 02/25/2012  . Gastritis and duodenitis 02/25/2012    H.Pylori s/p Pylera    Medications:  Scheduled:  . atorvastatin  40 mg Oral q1800  . furosemide  40 mg Intravenous BID  . heparin  5,000 Units Subcutaneous 3 times per day  . ipratropium-albuterol   3 mL Nebulization Q4H  . [START ON 01/25/2014] levofloxacin (LEVAQUIN) IV  500 mg Intravenous Q48H  . [START ON 01/24/2014] losartan  100 mg Oral Daily  . methylPREDNISolone (SOLU-MEDROL) injection  125 mg Intravenous Once  . [START ON 01/24/2014] metoprolol succinate  50 mg Oral Daily  . pantoprazole  40 mg Oral BID AC  . [START ON 01/24/2014] predniSONE  50 mg Oral Q breakfast  . senna  1 tablet Oral BID  . sodium chloride  3 mL Intravenous Q12H  . sodium chloride  3 mL Intravenous Q12H   Assessment: CAP, Levaquin 750 mg IV given in ED CHF and COPD exacerbation Reduced renal function   Goal of Therapy:  Eradicate infection   Plan:  Levaquin 500 mg IV every 48 hours, next dose 01/25/2014 at 4 PM Monitor renal function Labs per protocol  Raquel JamesPittman, Jasin Brazel Bennett 01/23/2014,6:21 PM

## 2014-01-23 NOTE — ED Notes (Signed)
Pt c/o SOB since yesterday. Did not bring home O2.

## 2014-01-23 NOTE — ED Notes (Signed)
Attempted report x 1. Nurse will call back

## 2014-01-23 NOTE — ED Notes (Signed)
Called for Triage. No answer 

## 2014-01-23 NOTE — ED Notes (Signed)
Dr. Roselyn BeringJ. Knapp aware of I-STAT trop result.

## 2014-01-24 ENCOUNTER — Inpatient Hospital Stay (HOSPITAL_COMMUNITY): Payer: Medicare HMO

## 2014-01-24 DIAGNOSIS — J9611 Chronic respiratory failure with hypoxia: Secondary | ICD-10-CM | POA: Insufficient documentation

## 2014-01-24 DIAGNOSIS — I5031 Acute diastolic (congestive) heart failure: Secondary | ICD-10-CM | POA: Insufficient documentation

## 2014-01-24 DIAGNOSIS — I517 Cardiomegaly: Secondary | ICD-10-CM

## 2014-01-24 LAB — COMPREHENSIVE METABOLIC PANEL
ALT: 11 U/L (ref 0–53)
AST: 32 U/L (ref 0–37)
Albumin: 3.1 g/dL — ABNORMAL LOW (ref 3.5–5.2)
Alkaline Phosphatase: 67 U/L (ref 39–117)
Anion gap: 13 (ref 5–15)
BILIRUBIN TOTAL: 0.8 mg/dL (ref 0.3–1.2)
BUN: 57 mg/dL — ABNORMAL HIGH (ref 6–23)
CHLORIDE: 97 meq/L (ref 96–112)
CO2: 33 meq/L — AB (ref 19–32)
CREATININE: 2.37 mg/dL — AB (ref 0.50–1.35)
Calcium: 9.1 mg/dL (ref 8.4–10.5)
GFR calc Af Amer: 29 mL/min — ABNORMAL LOW (ref >90)
GFR calc non Af Amer: 25 mL/min — ABNORMAL LOW (ref >90)
Glucose, Bld: 144 mg/dL — ABNORMAL HIGH (ref 70–99)
Potassium: 4.4 mEq/L (ref 3.7–5.3)
Sodium: 143 mEq/L (ref 137–147)
Total Protein: 6.5 g/dL (ref 6.0–8.3)

## 2014-01-24 LAB — URINALYSIS, ROUTINE W REFLEX MICROSCOPIC
Bilirubin Urine: NEGATIVE
Glucose, UA: NEGATIVE mg/dL
Hgb urine dipstick: NEGATIVE
Leukocytes, UA: NEGATIVE
NITRITE: NEGATIVE
Protein, ur: NEGATIVE mg/dL
Specific Gravity, Urine: 1.01 (ref 1.005–1.030)
Urobilinogen, UA: 0.2 mg/dL (ref 0.0–1.0)
pH: 5.5 (ref 5.0–8.0)

## 2014-01-24 LAB — IRON AND TIBC
Iron: 84 ug/dL (ref 42–135)
Saturation Ratios: 36 % (ref 20–55)
TIBC: 232 ug/dL (ref 215–435)
UIBC: 148 ug/dL (ref 125–400)

## 2014-01-24 LAB — HIV ANTIBODY (ROUTINE TESTING W REFLEX): HIV: NONREACTIVE

## 2014-01-24 LAB — STREP PNEUMONIAE URINARY ANTIGEN: Strep Pneumo Urinary Antigen: NEGATIVE

## 2014-01-24 LAB — CBC
HCT: 27.4 % — ABNORMAL LOW (ref 39.0–52.0)
Hemoglobin: 9.3 g/dL — ABNORMAL LOW (ref 13.0–17.0)
MCH: 33.1 pg (ref 26.0–34.0)
MCHC: 33.9 g/dL (ref 30.0–36.0)
MCV: 97.5 fL (ref 78.0–100.0)
Platelets: 165 10*3/uL (ref 150–400)
RBC: 2.81 MIL/uL — ABNORMAL LOW (ref 4.22–5.81)
RDW: 13.8 % (ref 11.5–15.5)
WBC: 5.3 10*3/uL (ref 4.0–10.5)

## 2014-01-24 LAB — TSH: TSH: 0.966 u[IU]/mL (ref 0.350–4.500)

## 2014-01-24 LAB — VITAMIN B12: Vitamin B-12: 599 pg/mL (ref 211–911)

## 2014-01-24 LAB — FERRITIN: Ferritin: 274 ng/mL (ref 22–322)

## 2014-01-24 LAB — FOLATE: Folate: >20 ng/mL

## 2014-01-24 MED ORDER — LEVOFLOXACIN IN D5W 750 MG/150ML IV SOLN: 750.0000 mg | INTRAVENOUS | Status: DC

## 2014-01-24 NOTE — Progress Notes (Signed)
UR chart review completed.  

## 2014-01-24 NOTE — Plan of Care (Signed)
Problem: Consults Goal: COPD Patient Education (See Patient Education Module for education specifics.)  Outcome: Completed/Met Date Met:  01/24/14 Goal: Skin Care Protocol Initiated - if Braden Score 18 or less If consults are not indicated, leave blank or document N/A  Outcome: Not Applicable Date Met:  74/73/40 Goal: Diabetes Guidelines if Diabetic/Glucose > 140 If diabetic or lab glucose is > 140 mg/dl - Initiate Diabetes/Hyperglycemia Guidelines & Document Interventions  Outcome: Not Applicable Date Met:  37/09/64

## 2014-01-24 NOTE — Progress Notes (Signed)
Nutrition Brief Note  Patient identified on the Malnutrition Screening Tool (MST) Report. He presents with pneumonia.   SODIUM  Date/Time Value Ref Range Status  01/24/2014 05:50 AM 143 137 - 147 mEq/L Final  01/23/2014 01:39 PM 144 137 - 147 mEq/L Final  09/22/2013 12:50 PM 141 137 - 147 mEq/L Final    POTASSIUM  Date/Time Value Ref Range Status  01/24/2014 05:50 AM 4.4 3.7 - 5.3 mEq/L Final  01/23/2014 01:39 PM 4.4 3.7 - 5.3 mEq/L Final  09/22/2013 12:50 PM 4.5 3.7 - 5.3 mEq/L Final    No results found for: PHOS  MAGNESIUM  Date/Time Value Ref Range Status  01/23/2014 07:07 PM 2.3 1.5 - 2.5 mg/dL Final     Wt Readings from Last 15 Encounters:  01/24/14 169 lb 8.5 oz (76.9 kg)  09/21/13 185 lb (83.915 kg)  03/19/13 181 lb (82.101 kg)  07/21/12 161 lb (73.029 kg)  06/27/12 160 lb 6.4 oz (72.757 kg)  02/25/12 164 lb 10.9 oz (74.7 kg)  02/14/12 168 lb (76.204 kg)  02/03/12 163 lb 14.4 oz (74.345 kg)  01/19/12 166 lb (75.297 kg)  01/07/12 172 lb (78.019 kg)  12/30/11 174 lb 6.4 oz (79.107 kg)  11/01/11 169 lb (76.658 kg)  11/03/09 181 lb (82.101 kg)    Body mass index is 26.55 kg/(m^2). Patient meets criteria for overweight based on current BMI. Usual body weight range 170-180#.  Pt has good appetite. Current diet order is heart Healthy, and he is consuming approximately 50-75% of meals at this time. Labs and medications reviewed.    Royann ShiversLynn Syrai Gladwin MS,RD,CSG,LDN Office: [phone] Pager: [phone]

## 2014-01-24 NOTE — Care Management Note (Addendum)
    Page 1 of 2   01/25/2014     3:46:13 PM CARE MANAGEMENT NOTE 01/25/2014  Patient:  Roberto Nelson,Roberto Nelson   Account Number:  [id number]  Date Initiated:  01/24/2014  Documentation initiated by:  Sharrie RothmanBLACKWELL,Imanuel Pruiett C  Subjective/Objective Assessment:   Pt admitted from home with COPD and pneumonia. Pt lives alone and has a friend that helps him with meals and errands. Pt has a cane for home use and home O2 with [employer].     Action/Plan:   Will continue to follow for discharge planning needs. ? HH needs at discharge.   Anticipated DC Date:  01/28/2014   Anticipated DC Plan:  HOME/SELF CARE      DC Planning Services  CM consult      [facility]   Choice offered to / List presented to:  C-1 Patient        HH arranged  HH-1 RN      [facility]   Status of service:  Completed, signed off Medicare Important Message given?  YES (If response is "NO", the following Medicare IM given date fields will be blank) Date Medicare IM given:  01/25/2014 Medicare IM given by:  Sharrie RothmanBLACKWELL,Melesio Madara C Date Additional Medicare IM given:   Additional Medicare IM given by:    Discharge Disposition:  HOME [facility]  Per UR Regulation:    If discussed at Long Length of Stay Meetings, dates discussed:    Comments:  01/25/14 1545 Arlyss Queenammy Laqueta Bonaventura, RN BSN CM [facility]). Alroy BailiffLinda Lothian of [facility] is aware and will collect the pts information from the chart. HH services to start within 48 hours of discharge. Pt and pts nurse aware of discharge arrangements.  01/25/14 1050 Arlyss Queenammy Shannon Kirkendall, RN BSN CM Pt should d/c over the weekend. No CM needs noted. If pt should need any HH services, the weekend staff can arrange.  01/24/14 1045 Arlyss Queenammy Jamina Macbeth, Charity fundraiserN BSN CM

## 2014-01-24 NOTE — Progress Notes (Signed)
PROGRESS NOTE  Roberto Nelson WUJ:[id number] DOB: 05-04-1933 DOA: 01/23/2014 PCP: Catalina PizzaHALL, ZACH, MD  Summary: 80 year old man presented with increasing shortness of breath. Admitted for community-acquired pneumonia, COPD exacerbation, possible heart failure exacerbation, acute on chronic hypoxic respiratory failure.  Assessment/Plan: 1. Acute on chronic hypoxic respiratory failure secondary to community-acquired pneumonia. Improving rapidly. 2. Community-acquired pneumonia. Improving rapidly. 3. COPD exacerbation, improving rapidly. 4. Acute diastolic heart failure, appears clinically resolved. Weight stable. 5. Chronic hypoxic respiratory failure on 3 L nasal cannula at home 6. Chronic kidney disease stage IV? Urinalysis and renal ultrasound unremarkable. Stable since July. Follow-up as an outpatient. 7. Anemia likely from chronic disease, appears stable 8. Chronic right bundle branch block   Rapidly improving, likely change to oral abx 11/13.  Check BMP in AM, discontinue Lasix. Hold Cozaar.  Possible discharge 11/13.  Code Status: full code DVT prophylaxis: heparin Family Communication: none present Disposition Plan: home  Brendia Sacksaniel Goodrich, MD  [facility]  Pager [phone] If 7PM-7AM, please contact night-coverage at [email or web address], password [email or web address] 01/24/2014, 2:04 PM  LOS: 1 day   Consultants:    Procedures:    Antibiotics:  Levaquin 11/11 >>  HPI/Subjective: He is feeling much better today. Breathing better. Eating well. No complaints.  Objective: Filed Vitals:   01/24/14 0433 01/24/14 0723 01/24/14 1209 01/24/14 1314  BP: 119/51   118/73  Pulse: 87   55  Temp: 98.4 F (36.9 C)   98.9 F (37.2 C)  TempSrc: Oral   Oral  Resp: 20   20  Height:      Weight: 76.9 kg (169 lb 8.5 oz)     SpO2: 96% 95% 95% 100%    Intake/Output Summary (Last 24 hours) at 01/24/14 1404 Last data filed at 01/24/14 1200  Gross per 24 hour  Intake    240 ml  Output     500 ml  Net   -260 ml     Filed Weights   01/23/14 1314 01/23/14 1756 01/24/14 0433  Weight: 93.895 kg (207 lb) 77.928 kg (171 lb 12.8 oz) 76.9 kg (169 lb 8.5 oz)    Exam:     Afebrile, vital signs stable. Stable hypoxia on 2 L.  General: appears calm, comfortable.  Psych: alert. Speech fluent and clear.  CV: regular rate and rhythm. No murmur, rub or gallop. 1+ bilateral lower extremity edema.  Respiratory: clear to auscultation bilaterally. No wheezes, rales or rhonchi. Normal respiratory effort.  Abdomen: soft  Data Reviewed:  Creatinine appears to be at same level as July 2015. 2.37. Remainder of complete metabolic panel unremarkable.  Troponin on admission was negative, pro-calcitonin unremarkable. BNP 7954 on admission.  Hemoglobin stable compared to yesterday, 9.3.  Urinalysis negative.  Blood cultures pending.  Renal ultrasound unremarkable.  EKG sinus rhythm, right bundle branch block  Scheduled Meds: . atorvastatin  40 mg Oral q1800  . furosemide  40 mg Intravenous BID  . heparin  5,000 Units Subcutaneous 3 times per day  . ipratropium-albuterol  3 mL Nebulization Q4H  . [START ON 01/25/2014] levofloxacin (LEVAQUIN) IV  750 mg Intravenous Q48H  . losartan  100 mg Oral Daily  . methylPREDNISolone (SOLU-MEDROL) injection  125 mg Intravenous Once  . metoprolol succinate  50 mg Oral Daily  . pantoprazole  40 mg Oral BID AC  . predniSONE  50 mg Oral Q breakfast  . senna  1 tablet Oral BID  . sodium chloride  3 mL Intravenous Q12H  . sodium chloride  3  mL Intravenous Q12H   Continuous Infusions:   Active Problems:   Bilateral lower extremity edema   COPD exacerbation   CAP (community acquired pneumonia)   Acute exacerbation of congestive heart failure   Chronic kidney disease (CKD), stage IV (severe)   Anemia of chronic disease   Time spent 20 minutes

## 2014-01-24 NOTE — Progress Notes (Signed)
ANTIBIOTIC CONSULT NOTE - follow up  Pharmacy Consult for Levaquin Indication: pneumonia  Allergies  Allergen Reactions  . Penicillins Itching   Patient Measurements: Height: 5\' 7"  (170.2 cm) Weight: 169 lb 8.5 oz (76.9 kg) IBW/kg (Calculated) : 66.1  Vital Signs: Temp: 98.4 F (36.9 C) (11/12 0433) Temp Source: Oral (11/12 0433) BP: 119/51 mmHg (11/12 0433) Pulse Rate: 87 (11/12 0433) Intake/Output from previous day: 11/11 0701 - 11/12 0700 In: -  Out: 300 [Urine:300] Intake/Output from this shift:    Labs:  Recent Labs  01/23/14 1339 01/24/14 0550  WBC 6.9 5.3  HGB 9.9* 9.3*  PLT 177 165  CREATININE 2.60* 2.37*   Estimated Creatinine Clearance: 24 mL/min (by C-G formula based on Cr of 2.37). No results for input(s): VANCOTROUGH, VANCOPEAK, VANCORANDOM, GENTTROUGH, GENTPEAK, GENTRANDOM, TOBRATROUGH, TOBRAPEAK, TOBRARND, AMIKACINPEAK, AMIKACINTROU, AMIKACIN in the last 72 hours.   Microbiology: Recent Results (from the past 720 hour(s))  Culture, blood (routine x 2) Call MD if unable to obtain prior to antibiotics being given     Status: None (Preliminary result)   Collection Time: 01/23/14  1:39 PM  Result Value Ref Range Status   Specimen Description RIGHT ANTECUBITAL  Final   Special Requests BOTTLES DRAWN AEROBIC AND ANAEROBIC [facility]8CC EACH  Final   Culture PENDING  Incomplete   Report Status PENDING  Incomplete  Culture, blood (routine x 2) Call MD if unable to obtain prior to antibiotics being given     Status: None (Preliminary result)   Collection Time: 01/23/14  7:05 PM  Result Value Ref Range Status   Specimen Description BLOOD LEFT HAND  Final   Special Requests BOTTLES DRAWN AEROBIC ONLY 8CC  Final   Culture PENDING  Incomplete   Report Status PENDING  Incomplete   Medical History: Past Medical History  Diagnosis Date  . Edema   . Atherosclerosis   . Tobacco user   . Heart murmur   . Anginal pain   . Hypertension     [place]   [place]   . Shortness of breath     WITH EXERTION   . Cough   . CORONARY ATHEROSCLEROSIS NATIVE CORONARY ARTERY 11/03/2009    Qualifier: Diagnosis of  By: Via LPN, Larita FifeLynn    . COPD 11/03/2009    Qualifier: Diagnosis of  By: Via LPN, Larita FifeLynn    . TOBACCO ABUSE 11/03/2009    Qualifier: Diagnosis of  By: Daleen SquibbWall, MD, Lorenza CambridgeFACC, Thomas C   . Incarcerated inguinal hernia, unilateral 02/14/2012    Status post repair, per Dr. Abbey Chattersosenbower  . Right bundle branch block and left anterior fascicular block 12/30/2011  . Anemia   . Antral ulcer 02/25/2012  . Gastritis and duodenitis 02/25/2012    H.Pylori s/p Pylera   Medications:  Scheduled:  . atorvastatin  40 mg Oral q1800  . furosemide  40 mg Intravenous BID  . heparin  5,000 Units Subcutaneous 3 times per day  . ipratropium-albuterol  3 mL Nebulization Q4H  . [START ON 01/25/2014] levofloxacin (LEVAQUIN) IV  750 mg Intravenous Q48H  . losartan  100 mg Oral Daily  . methylPREDNISolone (SOLU-MEDROL) injection  125 mg Intravenous Once  . metoprolol succinate  50 mg Oral Daily  . pantoprazole  40 mg Oral BID AC  . predniSONE  50 mg Oral Q breakfast  . senna  1 tablet Oral BID  . sodium chloride  3 mL Intravenous Q12H  . sodium chloride  3 mL Intravenous Q12H  Assessment: 80yo male with CAP, Levaquin 750 mg IV given in ED CHF and COPD exacerbation Reduced renal function, Estimated Creatinine Clearance: 24 mL/min (by C-G formula based on Cr of 2.37).  Goal of Therapy:  Eradicate infection   Plan:  Levaquin 750 mg IV every 48 hours Switch to PO when improved / appropriate Monitor renal function Labs per protocol  Valrie HartHall, Cathaleen Korol A 01/24/2014,10:36 AM

## 2014-01-24 NOTE — Progress Notes (Signed)
  Echocardiogram 2D Echocardiogram has been performed.  Georgian CoWILLIAMS, Reginna Sermeno 01/24/2014, 11:22 AM

## 2014-01-25 DIAGNOSIS — J9621 Acute and chronic respiratory failure with hypoxia: Secondary | ICD-10-CM

## 2014-01-25 LAB — BASIC METABOLIC PANEL
Anion gap: 9 (ref 5–15)
BUN: 55 mg/dL — ABNORMAL HIGH (ref 6–23)
CO2: 35 mEq/L — ABNORMAL HIGH (ref 19–32)
CREATININE: 2.36 mg/dL — AB (ref 0.50–1.35)
Calcium: 9.1 mg/dL (ref 8.4–10.5)
Chloride: 96 mEq/L (ref 96–112)
GFR calc non Af Amer: 25 mL/min — ABNORMAL LOW (ref >90)
GFR, EST AFRICAN AMERICAN: 29 mL/min — AB (ref >90)
Glucose, Bld: 99 mg/dL (ref 70–99)
Potassium: 3.9 mEq/L (ref 3.7–5.3)
Sodium: 140 mEq/L (ref 137–147)

## 2014-01-25 LAB — LEGIONELLA ANTIGEN, URINE

## 2014-01-25 LAB — PROCALCITONIN: Procalcitonin: <0.1 ng/mL

## 2014-01-25 LAB — TROPONIN I

## 2014-01-25 MED ORDER — PREDNISONE 10 MG PO TABS: ORAL_TABLET | ORAL | Status: DC

## 2014-01-25 MED ORDER — LEVOFLOXACIN 750 MG PO TABS
750.0000 mg | ORAL_TABLET | ORAL | Status: DC
  Administered 2014-01-25: 750 mg via ORAL
  Filled 2014-01-25: qty 1

## 2014-01-25 MED ORDER — LEVOFLOXACIN 750 MG PO TABS: 750.0000 mg | ORAL_TABLET | ORAL | Status: AC

## 2014-01-25 MED ORDER — PREDNISONE 10 MG PO TABS: ORAL_TABLET | ORAL | Status: AC

## 2014-01-25 MED ORDER — FUROSEMIDE 40 MG PO TABS: 40.0000 mg | ORAL_TABLET | Freq: Two times a day (BID) | ORAL | Status: AC

## 2014-01-25 MED ORDER — LEVOFLOXACIN 750 MG PO TABS: 750.0000 mg | ORAL_TABLET | ORAL | Status: DC

## 2014-01-25 NOTE — Progress Notes (Signed)
PROGRESS NOTE  Roberto Nelson [id number] DOB: [birth year] DOA: 01/23/2014 PCP: Catalina PizzaHALL, ZACH, MD  Summary: 80 year old man presented with increasing shortness of breath. Admitted for community-acquired pneumonia, COPD exacerbation, possible heart failure exacerbation, acute on chronic hypoxic respiratory failure.  Assessment/Plan: 1. Acute on chronic hypoxic respiratory failure secondary to community-acquired pneumonia. Acute component resolved. Breathing at baseline. 2. Community-acquired pneumonia. Afebrile, stable hypoxia and respiratory status. Appears clinically resolved. 3. COPD exacerbation, appears resolved. 4. Acute diastolic heart failure, appears clinically resolved. Weight down 2 kg since admission. 5. Chronic hypoxic respiratory failure on 3 L nasal cannula at home 6. Chronic kidney disease stage IV? Urinalysis and renal ultrasound unremarkable. Stable since July. Follow-up as an outpatient. 7. Anemia likely from chronic disease, appears stable. 8. Chronic right bundle branch block   Plan discharge home later today on oral Levaquin.  Outpatient follow-up in 1 week, follow-up BMP, could consider nephrology follow-up as an outpatient.  Plan home health nurse for disease management.  Discussed with friend at bedside   Brendia Sacksaniel Carrell Palmatier, MD  [facility]  Pager [phone] If 7PM-7AM, please contact night-coverage at [email or web address], password [email or web address] 01/25/2014, 11:16 AM  LOS: 2 days   Consultants:    Procedures:  2-D echocardiogram: Left ventricular ejection fraction 55-60 percent. Grade 1 diastolic dysfunction. Mild LVH.  Antibiotics:  Levaquin 11/11 >> 11/17  HPI/Subjective: Feels much better. Breathing back to baseline. Some cough. Wants to go home.   Objective: Filed Vitals:   01/25/14 0037 01/25/14 0433 01/25/14 0604 01/25/14 0729  BP:   127/62   Pulse:   67   Temp:   99.2 F (37.3 C)   TempSrc:   Oral   Resp:   20   Height:      Weight:    75.615 kg (166 lb 11.2 oz)   SpO2: 96% 97% 95% 94%    Intake/Output Summary (Last 24 hours) at 01/25/14 1116 Last data filed at 01/25/14 0950  Gross per 24 hour  Intake    180 ml  Output   1375 ml  Net  -1195 ml     Filed Weights   01/23/14 1756 01/24/14 0433 01/25/14 0604  Weight: 77.928 kg (171 lb 12.8 oz) 76.9 kg (169 lb 8.5 oz) 75.615 kg (166 lb 11.2 oz)    Exam:     Afebrile, vital signs stable. Stable hypoxia.  Gen. Appears calm, comfortable. Sitting on the side of the bed.  Psychiatric. Alert. Speech fluent and clear.  Cardiovascular regular rate and rhythm. No murmur, rub or gallop. No lower extremity edema. Telemetry sinus rhythm   Respiratory. Fair air movement. Clear to auscultation. No wheezes, rales or rhonchi. Normal respiratory effort. Speaks in full sentences.  Data Reviewed:  Urine output 1375. -1.755 L since admission.  Creatinine has stabilized at 2.36 similar to values from July of this year.  Scheduled Meds: . atorvastatin  40 mg Oral q1800  . heparin  5,000 Units Subcutaneous 3 times per day  . ipratropium-albuterol  3 mL Nebulization Q4H  . levofloxacin  750 mg Oral QODAY  . methylPREDNISolone (SOLU-MEDROL) injection  125 mg Intravenous Once  . metoprolol succinate  50 mg Oral Daily  . pantoprazole  40 mg Oral BID AC  . predniSONE  50 mg Oral Q breakfast  . senna  1 tablet Oral BID  . sodium chloride  3 mL Intravenous Q12H  . sodium chloride  3 mL Intravenous Q12H   Continuous Infusions:   Active Problems:   Bilateral lower  extremity edema   COPD exacerbation   CAP (community acquired pneumonia)   Acute exacerbation of congestive heart failure   Chronic kidney disease (CKD), stage IV (severe)   Anemia of chronic disease   Chronic hypoxemic respiratory failure   Acute diastolic CHF (congestive heart failure)

## 2014-01-25 NOTE — Discharge Summary (Addendum)
Physician Discharge Summary  Roberto Nelson [id number] DOB: Mar 10, 1934 DOA: 01/23/2014  PCP: Catalina PizzaHALL, ZACH, MD  Admit date: 01/23/2014 Discharge date: 01/25/2014  Recommendations for Outpatient Follow-up:  1. Resolution Of community-acquired pneumonia 2. Chronic kidney disease stage IV suspected. See discussion below. Consider outpatient nephrology follow-up. 3. Further management of chronic diastolic heart failure. 4. Anemia, likely chronic disease. Consider outpatient evaluation as clinically indicated.    Follow-up Information    Follow up with Catalina PizzaHALL, ZACH, MD. Schedule an appointment as soon as possible for a visit in 1 week.   Specialty:  Internal Medicine   Contact information:    [address]  [place] [address] [phone]      Discharge Diagnoses:  1. Acute on chronic hypoxic respiratory failure secondary to community-acquired pneumonia 2. Community acquired pneumonia 3. COPD exacerbation 4. Acute on chronic diastolic heart failure 5. Chronic hypoxic respiratory failure on 3 L nasal cannula 6. Suspected chronic kidney disease stage IV 7. Anemia likely chronic disease  Discharge Condition: improved Disposition: home with home health RN for disease management  Diet recommendation: heart healthy diet  Filed Weights   01/23/14 1756 01/24/14 0433 01/25/14 0604  Weight: 77.928 kg (171 lb 12.8 oz) 76.9 kg (169 lb 8.5 oz) 75.615 kg (166 lb 11.2 oz)    History of present illness:  80 year old man presented with increasing shortness of breath. Admitted for community-acquired pneumonia, COPD exacerbation, possible heart failure exacerbation, acute on chronic hypoxic respiratory failure.  Hospital Course:  [name] was treated with empiric antibiotics for pneumonia, steroids and bronchodilators for COPD exacerbation and diuresis for heart failure. His condition rapidly improved with stabilization of chronic hypoxia. Hospitalization was uncomplicated.  Individual issues as below.  1. Acute on chronic hypoxic respiratory failure secondary to community-acquired pneumonia. Acute component resolved. Breathing at baseline. 2. Community-acquired pneumonia. Afebrile, stable hypoxia and respiratory status. Appears clinically resolved. 3. COPD exacerbation, appears resolved. 4. Acute diastolic heart failure, appears clinically resolved. Weight down 2 kg since admission. 5. Chronic hypoxic respiratory failure on 3 L nasal cannula at home 6. Chronic kidney disease stage IV? Urinalysis and renal ultrasound unremarkable. Stable since July. Follow-up as an outpatient. 7. Anemia likely from chronic disease, appears stable. 8. Chronic right bundle branch block  Consultants:  none  Procedures:  2-D echocardiogram: Left ventricular ejection fraction 55-60 percent. Grade 1 diastolic dysfunction. Mild LVH.  Antibiotics:  Levaquin 11/11 >> 11/17  Discharge Instructions   Current Discharge Medication List    START taking these medications   Details  levofloxacin (LEVAQUIN) 750 MG tablet Take 1 tablet (750 mg total) by mouth every other day. Next dose 11/15 in the morning. Qty: 2 tablet, Refills: 0      CONTINUE these medications which have CHANGED   Details  furosemide (LASIX) 40 MG tablet Take 1 tablet (40 mg total) by mouth 2 (two) times daily.    predniSONE (DELTASONE) 10 MG tablet Start 11/14 . Take 40 mg by mouth daily for 3 days, then take 20 mg by mouth daily for 3 days, then take 10 mg by mouth daily for 3 days, then stop. Qty: 21 tablet, Refills: 0      CONTINUE these medications which have NOT CHANGED   Details  albuterol (PROAIR HFA) 108 (90 BASE) MCG/ACT inhaler Inhale 2 puffs into the lungs every 6 (six) hours as needed for wheezing or shortness of breath.    ALPRAZolam (XANAX) 0.25 MG tablet Take 0.25 mg by mouth every 6 (  six) hours as needed for anxiety.    atorvastatin (LIPITOR) 40 MG tablet Take 40 mg by mouth daily.     budesonide-formoterol (SYMBICORT) 160-4.5 MCG/ACT inhaler Take 2 puffs first thing in am and then another 2 puffs about 12 hours later. Qty: 1 Inhaler, Refills: 11    losartan (COZAAR) 100 MG tablet Take 100 mg by mouth daily.    metoprolol (TOPROL-XL) 50 MG 24 hr tablet Take 50 mg by mouth daily.      pantoprazole (PROTONIX) 40 MG tablet Take 1 tablet (40 mg total) by mouth 2 (two) times daily before a meal. For treatment of ulcer. Qty: 60 tablet, Refills: 3    tiotropium (SPIRIVA) 18 MCG inhalation capsule Place 18 mcg into inhaler and inhale daily.      STOP taking these medications     meloxicam (MOBIC) 7.5 MG tablet      traMADol (ULTRAM) 50 MG tablet      doxycycline (VIBRAMYCIN) 100 MG capsule      gabapentin (NEURONTIN) 100 MG capsule      ondansetron (ZOFRAN-ODT) 4 MG disintegrating tablet      predniSONE (STERAPRED UNI-PAK) 10 MG tablet        Allergies  Allergen Reactions  . Penicillins Itching    The results of significant diagnostics from this hospitalization (including imaging, microbiology, ancillary and laboratory) are listed below for reference.    Significant Diagnostic Studies: Dg Chest 2 View  01/23/2014   CLINICAL DATA:  Shortness of breath.  EXAM: CHEST  2 VIEW  COMPARISON:  09/22/2013  FINDINGS: Borderline enlargement of the cardiopericardial silhouette emphysema. Increased right infrahilar markings and a new linear opacities along the right hemidiaphragm. Tortuous thoracic aorta. Thoracic spondylosis. No pleural effusion identified. Tapering of the peripheral pulmonary vasculature favors emphysema.  IMPRESSION: 1. Borderline cardiomegaly, without edema. 2. Linear opacities compatible with subsegmental atelectasis or less likely early bronchopneumonia at the right lung base and right infrahilar region. 3. Emphysema.   Electronically Signed   By: Herbie Baltimore M.D.   On: 01/23/2014 14:37   US Renal  01/24/2014   CLINICAL DATA:  Chronic renal  insufficiency  EXAM: RENAL/URINARY TRACT ULTRASOUND COMPLETE  COMPARISON:  None.  FINDINGS: Right Kidney:  Length: 11.1 cm. Echogenicity within normal limits. No mass or hydronephrosis visualized.  Left Kidney:  Length: 11.6 cm. Echogenicity within normal limits. No mass or hydronephrosis visualized.  Bladder:  There is subjective mild thickening of the bladder wall without a focal mass lesion.  IMPRESSION: There is no hydronephrosis nor significant renal cortical abnormality. Subjective mild lateral wall thickening is present.   Electronically Signed   By: David  [place]   On: 01/24/2014 12:13    Microbiology: Recent Results (from the past 240 hour(s))  Culture, blood (routine x 2) Call MD if unable to obtain prior to antibiotics being given     Status: None (Preliminary result)   Collection Time: 01/23/14  1:39 PM  Result Value Ref Range Status   Specimen Description RIGHT ANTECUBITAL  Final   Special Requests BOTTLES DRAWN AEROBIC AND ANAEROBIC 8CC EACH  Final   Culture NO GROWTH 1 DAY  Final   Report Status PENDING  Incomplete  Culture, blood (routine x 2) Call MD if unable to obtain prior to antibiotics being given     Status: None (Preliminary result)   Collection Time: 01/23/14  7:05 PM  Result Value Ref Range Status   Specimen Description BLOOD LEFT HAND  Final   Special Requests  BOTTLES DRAWN AEROBIC ONLY 8CC  Final   Culture NO GROWTH 1 DAY  Final   Report Status PENDING  Incomplete     Labs: Basic Metabolic Panel:  Recent Labs Lab 01/23/14 1339 01/23/14 1907 01/24/14 0550 01/25/14 0514  NA 144  --  143 140  K 4.4  --  4.4 3.9  CL 97  --  97 96  CO2 34*  --  33* 35*  GLUCOSE 118*  --  144* 99  BUN 61*  --  57* 55*  CREATININE 2.60*  --  2.37* 2.36*  CALCIUM 9.5  --  9.1 9.1  MG  --  2.3  --   --    Liver Function Tests:  Recent Labs Lab 01/24/14 0550  AST 32  ALT 11  ALKPHOS 67  BILITOT 0.8  PROT 6.5  ALBUMIN 3.1*   CBC:  Recent Labs Lab 01/23/14 1339  01/24/14 0550  WBC 6.9 5.3  NEUTROABS 5.3  --   HGB 9.9* 9.3*  HCT 29.2* 27.4*  MCV 97.7 97.5  PLT 177 165   Cardiac Enzymes:  Recent Labs Lab 01/23/14 1339  TROPONINI <0.30     Recent Labs  01/23/14 1339  PROBNP 7954.0*    Principal Problem:   CAP (community acquired pneumonia) Active Problems:   COPD exacerbation   Chronic kidney disease (CKD), stage IV (severe)   Anemia of chronic disease   Chronic hypoxemic respiratory failure   Acute diastolic CHF (congestive heart failure)   Acute on chronic respiratory failure with hypoxia   Time coordinating discharge: 35 minutes  Signed:  Brendia Sacksaniel Goodrich, MD [facility] 01/25/2014, 3:37 PM

## 2014-01-25 NOTE — Progress Notes (Signed)
1724 Pt given d/c instructions & confirmed understanding of d/c teaching & to F/U with PCP within 1wk of d/c. IV catheter removed from RIGHT forearm prior to d/c, catheter intact w/no s/s of infection noted. Pt assisted to vehicle via w/c by staff.

## 2014-01-25 NOTE — Progress Notes (Signed)
ANTIBIOTIC CONSULT NOTE  Pharmacy Consult for Levaquin Indication: pneumonia  Allergies  Allergen Reactions  . Penicillins Itching   Patient Measurements: Height: 5\' 7"  (170.2 cm) Weight: 166 lb 11.2 oz (75.615 kg) IBW/kg (Calculated) : 66.1  Vital Signs: Temp: 99.2 F (37.3 C) (11/13 0604) Temp Source: Oral (11/13 0604) BP: 127/62 mmHg (11/13 0604) Pulse Rate: 67 (11/13 0604) Intake/Output from previous day: 11/12 0701 - 11/13 0700 In: 240 [P.O.:240] Out: 1375 [Urine:1375] Intake/Output from this shift:    Labs:  Recent Labs  01/23/14 1339 01/24/14 0550 01/25/14 0514  WBC 6.9 5.3  --   HGB 9.9* 9.3*  --   PLT 177 165  --   CREATININE 2.60* 2.37* 2.36*   Estimated Creatinine Clearance: 24.1 mL/min (by C-G formula based on Cr of 2.36). No results for input(s): VANCOTROUGH, VANCOPEAK, VANCORANDOM, GENTTROUGH, GENTPEAK, GENTRANDOM, TOBRATROUGH, TOBRAPEAK, TOBRARND, AMIKACINPEAK, AMIKACINTROU, AMIKACIN in the last 72 hours.   Microbiology: Recent Results (from the past 720 hour(s))  Culture, blood (routine x 2) Call MD if unable to obtain prior to antibiotics being given     Status: None (Preliminary result)   Collection Time: 01/23/14  1:39 PM  Result Value Ref Range Status   Specimen Description RIGHT ANTECUBITAL  Final   Special Requests BOTTLES DRAWN AEROBIC AND ANAEROBIC 8CC EACH  Final   Culture NO GROWTH 1 DAY  Final   Report Status PENDING  Incomplete  Culture, blood (routine x 2) Call MD if unable to obtain prior to antibiotics being given     Status: None (Preliminary result)   Collection Time: 01/23/14  7:05 PM  Result Value Ref Range Status   Specimen Description BLOOD LEFT HAND  Final   Special Requests BOTTLES DRAWN AEROBIC ONLY 8CC  Final   Culture NO GROWTH 1 DAY  Final   Report Status PENDING  Incomplete   Medical History: Past Medical History  Diagnosis Date  . Edema   . Atherosclerosis   . Tobacco user   . Heart murmur   . Anginal pain    . Hypertension     [place]  [place]   . Shortness of breath     WITH EXERTION   . Cough   . CORONARY ATHEROSCLEROSIS NATIVE CORONARY ARTERY 11/03/2009    Qualifier: Diagnosis of  By: Via LPN, Larita FifeLynn    . COPD 11/03/2009    Qualifier: Diagnosis of  By: Via LPN, Larita FifeLynn    . TOBACCO ABUSE 11/03/2009    Qualifier: Diagnosis of  By: Daleen SquibbWall, MD, Lorenza CambridgeFACC, Thomas C   . Incarcerated inguinal hernia, unilateral 02/14/2012    Status post repair, per Dr. Abbey Chattersosenbower  . Right bundle branch block and left anterior fascicular block 12/30/2011  . Anemia   . Antral ulcer 02/25/2012  . Gastritis and duodenitis 02/25/2012    H.Pylori s/p Pylera   Medications:  Scheduled:  . atorvastatin  40 mg Oral q1800  . heparin  5,000 Units Subcutaneous 3 times per day  . ipratropium-albuterol  3 mL Nebulization Q4H  . levofloxacin (LEVAQUIN) IV  750 mg Intravenous Q48H  . methylPREDNISolone (SOLU-MEDROL) injection  125 mg Intravenous Once  . metoprolol succinate  50 mg Oral Daily  . pantoprazole  40 mg Oral BID AC  . predniSONE  50 mg Oral Q breakfast  . senna  1 tablet Oral BID  . sodium chloride  3 mL Intravenous Q12H  . sodium chloride  3 mL Intravenous Q12H   Assessment: 80yo male with  CAP, HF and COPD exacerbation.  He is clinically improving.   Chronic renal failure noted, stable.  Dose adjusted for CrCl 20-5850ml/min.   Levaquin 11/11>>  Goal of Therapy:  Eradicate infection  Plan:  Change Levaquin 750 mg PO every 48 hours (dose due today) Monitor renal function Duration of therapy per MD  Elson ClanLilliston, Amali Uhls Michelle 01/25/2014,10:31 AM

## 2014-01-28 LAB — CULTURE, BLOOD (ROUTINE X 2)
Culture: NO GROWTH
Culture: NO GROWTH

## 2014-01-28 NOTE — Progress Notes (Signed)
UR chart review completed.  

## 2014-04-05 ENCOUNTER — Other Ambulatory Visit: Payer: Self-pay | Admitting: Internal Medicine

## 2014-06-21 IMAGING — CR DG CHEST 2V
3 series · 3 of 3 positions shown · non-contrast
Comparison: None.

CLINICAL DATA: Preop for hernia repair

CHEST - 2 VIEW

[view not recorded (1 of 3)]
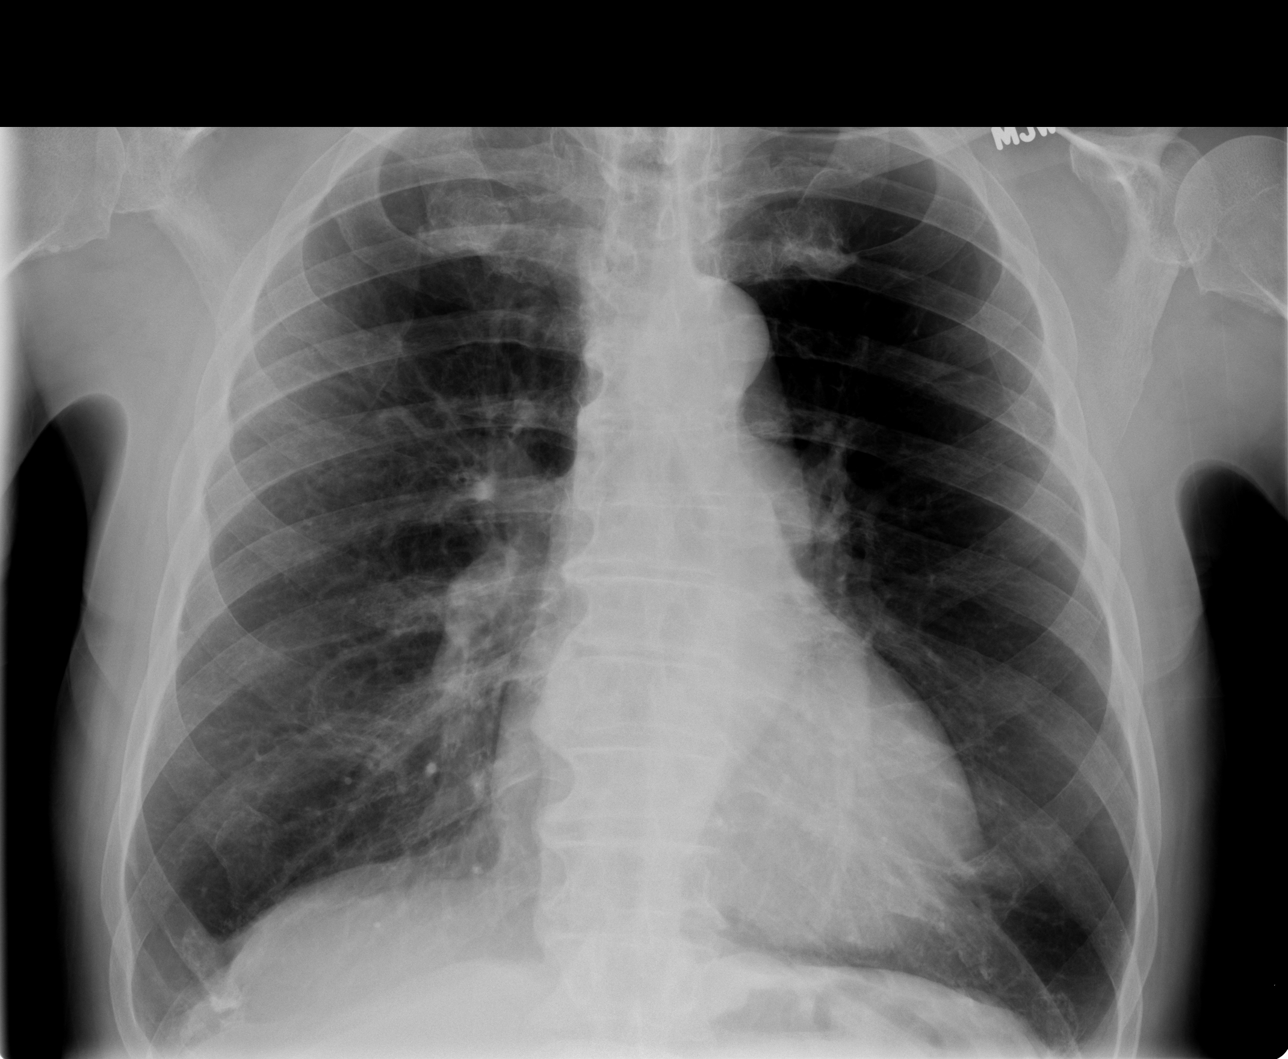

[view not recorded (2 of 3)]
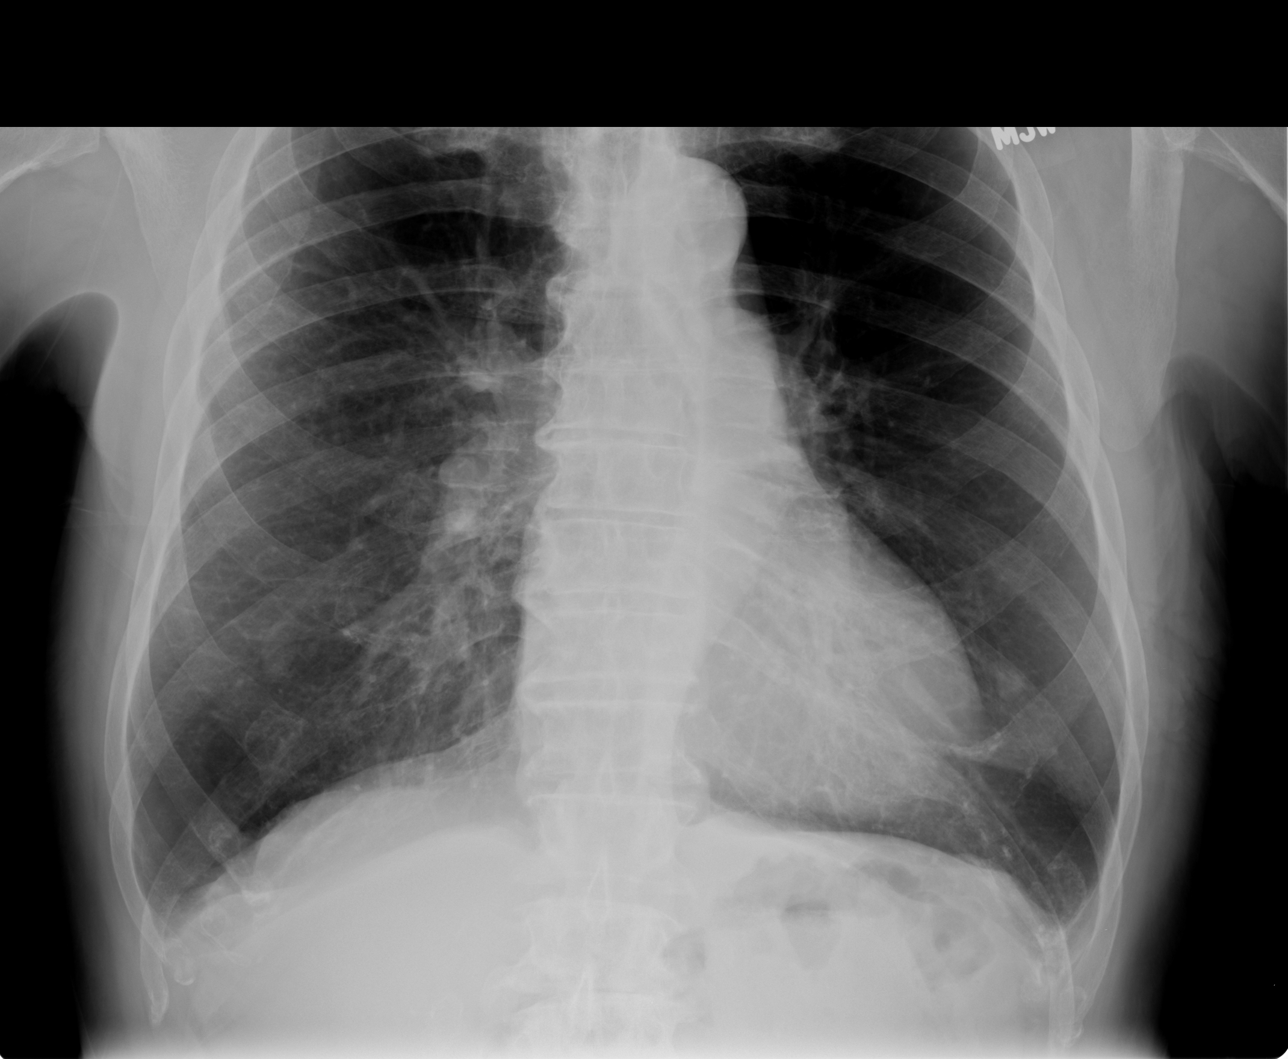

[view not recorded (3 of 3)]
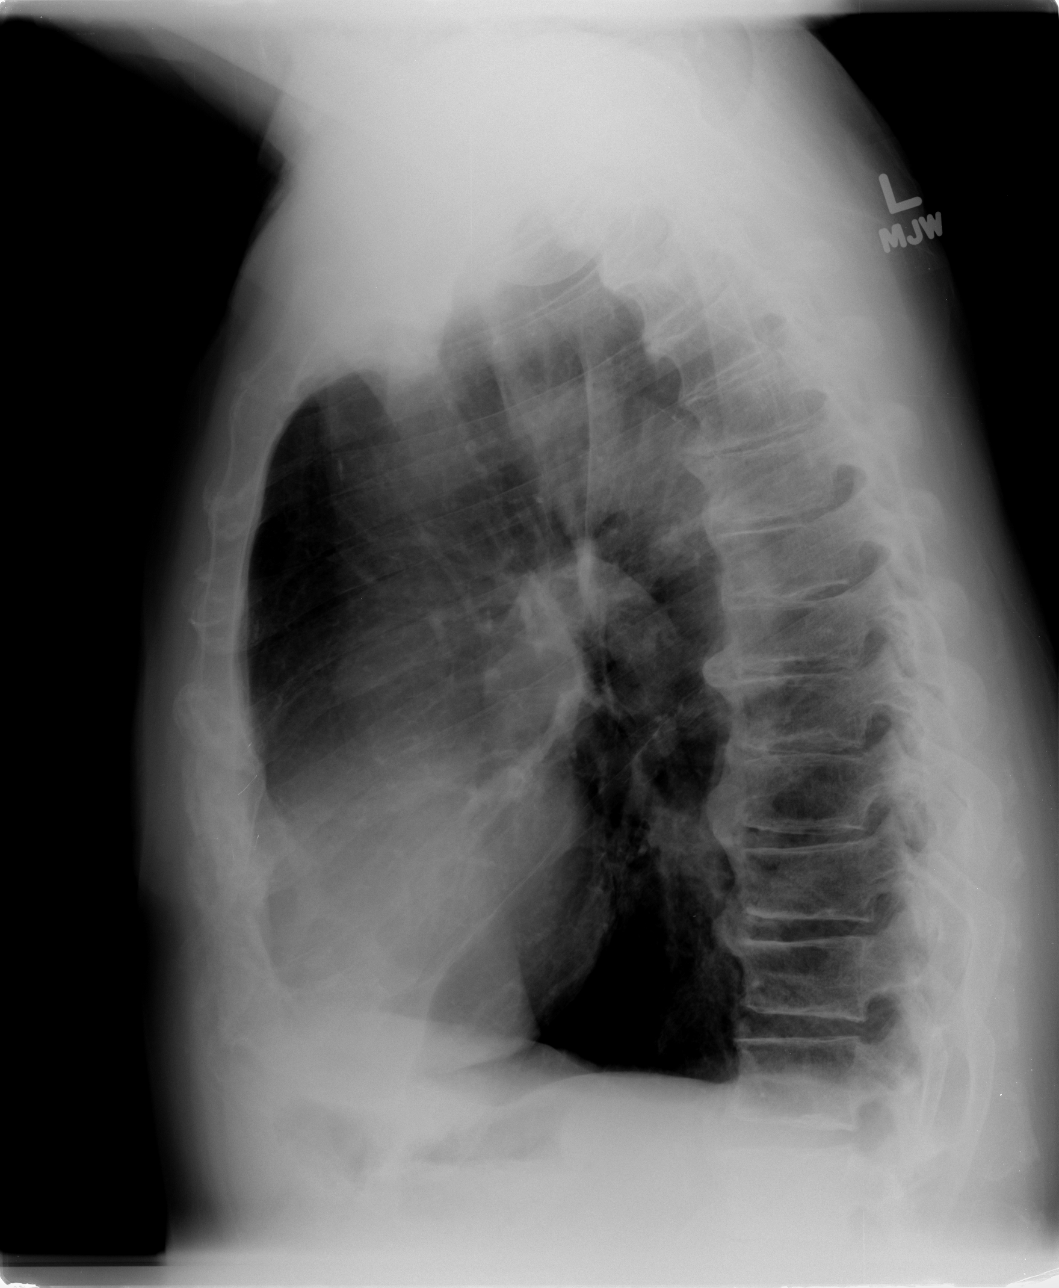

[3 of 3 positions shown; findings below may reference images not displayed]

FINDINGS: The lungs are very hyperaerated consistent with severe
COPD.  No infiltrate or effusion is seen.  Nodular opacities at
both lung bases are most consistent with nipple shadows but repeat
film with nipple markers could be obtained to confirm.  The heart
is mildly enlarged.  There are degenerative changes throughout the
thoracic spine
IMPRESSION: 1.  Severe COPD.
2.  No definite active process.
3.  Probable nipple shadows at the lung bases.  Consider repeat
film with nipple markers.

## 2014-12-13 ENCOUNTER — Emergency Department (HOSPITAL_COMMUNITY)
Admission: EM | Admit: 2014-12-13 | Discharge: 2014-12-14 | Disposition: A | Discharge status: Home / Self Care | Payer: Medicare HMO | Attending: Emergency Medicine | Admitting: Emergency Medicine

## 2014-12-13 ENCOUNTER — Encounter (HOSPITAL_COMMUNITY): Payer: Self-pay

## 2014-12-13 ENCOUNTER — Emergency Department (HOSPITAL_COMMUNITY): Payer: Medicare HMO

## 2014-12-13 DIAGNOSIS — Z8719 Personal history of other diseases of the digestive system: Secondary | ICD-10-CM | POA: Diagnosis not present

## 2014-12-13 DIAGNOSIS — I25119 Atherosclerotic heart disease of native coronary artery with unspecified angina pectoris: Secondary | ICD-10-CM | POA: Diagnosis not present

## 2014-12-13 DIAGNOSIS — Z79899 Other long term (current) drug therapy: Secondary | ICD-10-CM | POA: Insufficient documentation

## 2014-12-13 DIAGNOSIS — R2242 Localized swelling, mass and lump, left lower limb: Secondary | ICD-10-CM | POA: Insufficient documentation

## 2014-12-13 DIAGNOSIS — L03115 Cellulitis of right lower limb: Secondary | ICD-10-CM | POA: Diagnosis not present

## 2014-12-13 DIAGNOSIS — N189 Chronic kidney disease, unspecified: Secondary | ICD-10-CM | POA: Diagnosis not present

## 2014-12-13 DIAGNOSIS — R011 Cardiac murmur, unspecified: Secondary | ICD-10-CM | POA: Insufficient documentation

## 2014-12-13 DIAGNOSIS — Z792 Long term (current) use of antibiotics: Secondary | ICD-10-CM | POA: Insufficient documentation

## 2014-12-13 DIAGNOSIS — Z862 Personal history of diseases of the blood and blood-forming organs and certain disorders involving the immune mechanism: Secondary | ICD-10-CM | POA: Insufficient documentation

## 2014-12-13 DIAGNOSIS — Z880 Allergy status to penicillin: Secondary | ICD-10-CM | POA: Insufficient documentation

## 2014-12-13 DIAGNOSIS — I129 Hypertensive chronic kidney disease with stage 1 through stage 4 chronic kidney disease, or unspecified chronic kidney disease: Secondary | ICD-10-CM | POA: Insufficient documentation

## 2014-12-13 DIAGNOSIS — Z9981 Dependence on supplemental oxygen: Secondary | ICD-10-CM | POA: Diagnosis not present

## 2014-12-13 DIAGNOSIS — Z9861 Coronary angioplasty status: Secondary | ICD-10-CM | POA: Insufficient documentation

## 2014-12-13 DIAGNOSIS — Z9889 Other specified postprocedural states: Secondary | ICD-10-CM | POA: Insufficient documentation

## 2014-12-13 DIAGNOSIS — Z87891 Personal history of nicotine dependence: Secondary | ICD-10-CM | POA: Insufficient documentation

## 2014-12-13 DIAGNOSIS — J449 Chronic obstructive pulmonary disease, unspecified: Secondary | ICD-10-CM | POA: Diagnosis not present

## 2014-12-13 DIAGNOSIS — R2241 Localized swelling, mass and lump, right lower limb: Secondary | ICD-10-CM | POA: Diagnosis present

## 2014-12-13 MED ORDER — CLINDAMYCIN PHOSPHATE 600 MG/50ML IV SOLN
600.0000 mg | Freq: Once | INTRAVENOUS | Status: AC
  Administered 2014-12-14: 600 mg via INTRAVENOUS
  Filled 2014-12-13: qty 50

## 2014-12-13 NOTE — ED Provider Notes (Signed)
CSN: [id number]     Arrival date & time 12/13/14  2244 History  By signing my name below, I, Murriel Hopper, attest that this documentation has been prepared under the direction and in the presence of Shon Baton, MD. Electronically Signed: Murriel Hopper, ED Scribe. 12/13/2014. 11:27 PM.    Chief Complaint  Patient presents with  . Leg Swelling      The history is provided by the patient. No language interpreter was used.   HPI Comments: Roberto Nelson is a 81 y.o. male who presents to the Emergency Department complaining of constant right lower leg swelling, redness and pain with a wound to his lateral lower leg that has been present for a few days. Pt states that he dropped a can on his foot the other day, and then the day afterwards spilled some chemicals on them, but states the wound and redness on his lateral lower leg is what he is mostly concerned about, and doesn't know when the wound appeared. Pt states the area is painful to touch. Denies pain currently. Pt states he uses 3L of oxygen constantly while he is at home. Pt denies fever.    Past Medical History  Diagnosis Date  . Edema   . Atherosclerosis   . Tobacco user   . Heart murmur   . Anginal pain   . Hypertension     [place]   . Shortness of breath     WITH EXERTION   . Cough   . CORONARY ATHEROSCLEROSIS NATIVE CORONARY ARTERY 11/03/2009    Qualifier: Diagnosis of  By: Via LPN, Larita Fife    . COPD 11/03/2009    Qualifier: Diagnosis of  By: Via LPN, Larita Fife    . TOBACCO ABUSE 11/03/2009    Qualifier: Diagnosis of  By: Daleen Squibb, MD, Lorenza [place] Incarcerated inguinal hernia, unilateral 02/14/2012    Status post repair, per Dr. Abbey Chatters  . Right bundle branch block and left anterior fascicular block 12/30/2011  . Anemia   . Antral ulcer 02/25/2012  . Gastritis and duodenitis 02/25/2012    H.Pylori s/p Pylera   Past Surgical History  Procedure Laterality Date  . Colonoscopy      IN [place]  AROUND 2008, NEGATIVE PER PATIENT  . Cardiac catheterization      2005 IN [place]   . Coronary angioplasty    . Inguinal hernia repair  02/14/2012    Procedure: HERNIA REPAIR INGUINAL ADULT;  Surgeon: Adolph Pollack, MD;  Location: [facility] OR;  Service: General;  Laterality: Right;  . Insertion of mesh  02/14/2012    Procedure: INSERTION OF MESH;  Surgeon: Adolph Pollack, MD;  Location: [facility] OR;  Service: General;  Laterality: Right;  . Esophagogastroduodenoscopy  02/25/2012    WUJ:WJXBJ hiatal hernia/Large non-bleeding ulcer was found in the gastric antrum/MILD Duodenal inflammation. H.Pylori gastritis on bx  . Esophagogastroduodenoscopy N/A 07/21/2012    Procedure: ESOPHAGOGASTRODUODENOSCOPY (EGD);  Surgeon: West Bali, MD;  Location: [facility] SUITE;  Service: Endoscopy;  Laterality: N/A;  8:30   Family History  Problem Relation Age of Onset  . Colon cancer Neg Hx   . Liver disease Neg Hx    Social History  Substance Use Topics  . Smoking status: Former Smoker -- 0.50 packs/day for 50 years    Types: Cigarettes    Quit date: 11/13/2012  . Smokeless tobacco: None  . Alcohol Use: No     Comment: BEER/cocktail socially. No  heavy use per patient.     Review of Systems  Constitutional: Negative.  Negative for fever.  Respiratory: Negative.  Negative for chest tightness and shortness of breath.   Cardiovascular: Negative.  Negative for chest pain.  Gastrointestinal: Negative.  Negative for abdominal pain.  Genitourinary: Negative.  Negative for dysuria.  Skin: Positive for color change and wound. Negative for rash.  All other systems reviewed and are negative.     Allergies  Penicillins  Home Medications   Prior to Admission medications   Medication Sig Start Date End Date Taking? Authorizing Provider  albuterol (PROAIR HFA) 108 (90 BASE) MCG/ACT inhaler Inhale 2 puffs into the lungs every 6 (six) hours as needed for wheezing or shortness of breath.    Historical Provider, MD   ALPRAZolam Prudy Feeler) 0.25 MG tablet Take 0.25 mg by mouth every 6 (six) hours as needed for anxiety.    Historical Provider, MD  atorvastatin (LIPITOR) 40 MG tablet Take 40 mg by mouth daily.    Historical Provider, MD  budesonide-formoterol (SYMBICORT) 160-4.5 MCG/ACT inhaler Take 2 puffs first thing in am and then another 2 puffs about 12 hours later. 03/19/13   Nyoka Cowden, MD  clindamycin (CLEOCIN) 150 MG capsule Take 2 capsules (300 mg total) by mouth 3 (three) times daily. 12/14/14   Shon Baton, MD  furosemide (LASIX) 40 MG tablet Take 1 tablet (40 mg total) by mouth 2 (two) times daily. 01/25/14   Standley Brooking, MD  levofloxacin (LEVAQUIN) 750 MG tablet Take 1 tablet (750 mg total) by mouth every other day. Next dose 11/15 in the morning. 01/25/14   Standley Brooking, MD  losartan (COZAAR) 100 MG tablet Take 100 mg by mouth daily.    Historical Provider, MD  metoprolol (TOPROL-XL) 50 MG 24 hr tablet Take 50 mg by mouth daily.      Historical Provider, MD  pantoprazole (PROTONIX) 40 MG tablet Take 1 tablet (40 mg total) by mouth 2 (two) times daily before a meal. For treatment of ulcer. 02/26/12   Elliot Cousin, MD  predniSONE (DELTASONE) 10 MG tablet Take 40 mg by mouth daily for 3 days, then take 20 mg by mouth daily for 3 days, then take 10 mg by mouth daily for 3 days, then stop. 01/25/14   Standley Brooking, MD  tiotropium (SPIRIVA) 18 MCG inhalation capsule Place 18 mcg into inhaler and inhale daily.    Historical Provider, MD   BP 158/97 mmHg  Pulse 65  Temp(Src) 98.2 F (36.8 C) (Oral)  Resp 16  Ht  (1.702 m)  Wt 190 lb (86.183 kg)  BMI 29.75 kg/m2  SpO2 96% Physical Exam  Constitutional: He is oriented to person, place, and time. No distress.  Elderly  HENT:  Head: Normocephalic and atraumatic.  Cardiovascular: Normal rate, regular rhythm and normal heart sounds.   No murmur heard. Pulmonary/Chest: Effort normal. No respiratory distress. He has no wheezes.   Coarse breath sounds bilaterally  Abdominal: Soft. There is no tenderness.  Musculoskeletal: He exhibits edema.  1+ bilateral lower extremity edema, slightly increased on the right, there is redness and erythema extending to the mid calf with a weeping wound over the lateral aspect of the right leg, warm to the touch, no crepitus, 2+ DP pulses bilaterally, neurovascularly intact  Neurological: He is alert and oriented to person, place, and time.  Skin: Skin is warm and dry.  See musculoskeletal above  Psychiatric: He has a normal mood  and affect.  Nursing note and vitals reviewed.   ED Course  Procedures (including critical care time)  DIAGNOSTIC STUDIES: Oxygen Saturation is 96% on [place], normal by my interpretation.    COORDINATION OF CARE: 11:115 PM Discussed treatment plan with pt at bedside and pt agreed to plan.   Labs Review Labs Reviewed  CBC WITH DIFFERENTIAL/PLATELET - Abnormal; Notable for the following:    RBC 3.18 (*)    Hemoglobin 10.1 (*)    HCT 31.4 (*)    Platelets 146 (*)    All other components within normal limits  BASIC METABOLIC PANEL - Abnormal; Notable for the following:    Chloride 96 (*)    CO2 36 (*)    Glucose, Bld 119 (*)    BUN 42 (*)    Creatinine, Ser 2.18 (*)    Calcium 8.4 (*)    GFR calc non Af Amer 27 (*)    GFR calc Af Amer 31 (*)    All other components within normal limits    Imaging Review Dg Foot Complete Right  12/13/2014   CLINICAL DATA:  Foot pain, redness and swelling. Wound to lateral leg. Dropped a can on foot a few days ago. Spilled Chemicals on foot a few days ago.  EXAM: RIGHT FOOT COMPLETE - 3+ VIEW  COMPARISON:  None.  FINDINGS: No acute fracture deformity. No dislocation. Moderate to severe first metatarsal phalangeal joint space narrowing with marginal spurring consistent with osteoarthrosis. Osteopenia without destructive bony lesions. Mild midfoot osteoarthrosis. Mild vascular calcifications, soft tissue planes are  nonsuspicious.  IMPRESSION: No acute fracture deformity or dislocation.  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   Electronically Signed   By: Awilda Metro M.D.   On: 12/13/2014 23:59   I have personally reviewed and evaluated these images and lab results as part of my medical decision-making.   EKG Interpretation None      MDM   Final diagnoses:  Cellulitis of right lower extremity    Patient presents with wound and what appears to be right lower extremity cellulitis. Nontoxic on exam. Denies fevers. Afebrile here. Basic labwork obtained and reassuring. No evidence of leukocytosis. Patient get was given 1 dose of clindamycin in the ER. Feel he is a candidate at this time for outpatient treatment. He has chronic renal insufficiency so do not feel Bactrim is a great choice of drug for him. Will discharge home on clindamycin. Patient was advised if he develops diarrhea, he should be reassessed and have his antibiotics changed. He was also advised if he develops fever, worsening redness, or pain he should be reevaluated. X-rays of the right foot are negative.  After history, exam, and medical workup I feel the patient has been appropriately medically screened and is safe for discharge home. Pertinent diagnoses were discussed with the patient. Patient was given return precautions.   I personally performed the services described in this documentation, which was scribed in my presence. The recorded information has been reviewed and is accurate.    Shon Baton, MD 12/14/14 [phone]

## 2014-12-13 NOTE — ED Notes (Signed)
C/o right lower leg and foot swelling with redness.

## 2014-12-14 DIAGNOSIS — Z8719 Personal history of other diseases of the digestive system: Secondary | ICD-10-CM | POA: Diagnosis not present

## 2014-12-14 DIAGNOSIS — R2242 Localized swelling, mass and lump, left lower limb: Secondary | ICD-10-CM | POA: Diagnosis not present

## 2014-12-14 DIAGNOSIS — Z792 Long term (current) use of antibiotics: Secondary | ICD-10-CM | POA: Diagnosis not present

## 2014-12-14 DIAGNOSIS — Z87891 Personal history of nicotine dependence: Secondary | ICD-10-CM | POA: Diagnosis not present

## 2014-12-14 DIAGNOSIS — Z9889 Other specified postprocedural states: Secondary | ICD-10-CM | POA: Diagnosis not present

## 2014-12-14 DIAGNOSIS — N189 Chronic kidney disease, unspecified: Secondary | ICD-10-CM | POA: Diagnosis not present

## 2014-12-14 DIAGNOSIS — I129 Hypertensive chronic kidney disease with stage 1 through stage 4 chronic kidney disease, or unspecified chronic kidney disease: Secondary | ICD-10-CM | POA: Diagnosis not present

## 2014-12-14 DIAGNOSIS — R011 Cardiac murmur, unspecified: Secondary | ICD-10-CM | POA: Diagnosis not present

## 2014-12-14 DIAGNOSIS — Z880 Allergy status to penicillin: Secondary | ICD-10-CM | POA: Diagnosis not present

## 2014-12-14 DIAGNOSIS — Z9861 Coronary angioplasty status: Secondary | ICD-10-CM | POA: Diagnosis not present

## 2014-12-14 DIAGNOSIS — Z862 Personal history of diseases of the blood and blood-forming organs and certain disorders involving the immune mechanism: Secondary | ICD-10-CM | POA: Diagnosis not present

## 2014-12-14 DIAGNOSIS — R2241 Localized swelling, mass and lump, right lower limb: Secondary | ICD-10-CM | POA: Diagnosis present

## 2014-12-14 DIAGNOSIS — L03115 Cellulitis of right lower limb: Secondary | ICD-10-CM | POA: Diagnosis not present

## 2014-12-14 DIAGNOSIS — Z9981 Dependence on supplemental oxygen: Secondary | ICD-10-CM | POA: Diagnosis not present

## 2014-12-14 DIAGNOSIS — Z79899 Other long term (current) drug therapy: Secondary | ICD-10-CM | POA: Diagnosis not present

## 2014-12-14 DIAGNOSIS — J449 Chronic obstructive pulmonary disease, unspecified: Secondary | ICD-10-CM | POA: Diagnosis not present

## 2014-12-14 DIAGNOSIS — I25119 Atherosclerotic heart disease of native coronary artery with unspecified angina pectoris: Secondary | ICD-10-CM | POA: Diagnosis not present

## 2014-12-14 LAB — CBC WITH DIFFERENTIAL/PLATELET
BASOS PCT: 0 %
Basophils Absolute: 0 10*3/uL (ref 0.0–0.1)
EOS ABS: 0.2 10*3/uL (ref 0.0–0.7)
EOS PCT: 3 %
HCT: 31.4 % — ABNORMAL LOW (ref 39.0–52.0)
HEMOGLOBIN: 10.1 g/dL — AB (ref 13.0–17.0)
Lymphocytes Relative: 22 %
Lymphs Abs: 1.5 10*3/uL (ref 0.7–4.0)
MCH: 31.8 pg (ref 26.0–34.0)
MCHC: 32.2 g/dL (ref 30.0–36.0)
MCV: 98.7 fL (ref 78.0–100.0)
Monocytes Absolute: 0.5 10*3/uL (ref 0.1–1.0)
Monocytes Relative: 7 %
NEUTROS PCT: 68 %
Neutro Abs: 4.6 10*3/uL (ref 1.7–7.7)
Platelets: 146 10*3/uL — ABNORMAL LOW (ref 150–400)
RBC: 3.18 MIL/uL — AB (ref 4.22–5.81)
RDW: 12.9 % (ref 11.5–15.5)
WBC: 6.8 10*3/uL (ref 4.0–10.5)

## 2014-12-14 LAB — BASIC METABOLIC PANEL
Anion gap: 7 (ref 5–15)
BUN: 42 mg/dL — ABNORMAL HIGH (ref 6–20)
CO2: 36 mmol/L — ABNORMAL HIGH (ref 22–32)
Calcium: 8.4 mg/dL — ABNORMAL LOW (ref 8.9–10.3)
Chloride: 96 mmol/L — ABNORMAL LOW (ref 101–111)
Creatinine, Ser: 2.18 mg/dL — ABNORMAL HIGH (ref 0.61–1.24)
GFR calc non Af Amer: 27 mL/min — ABNORMAL LOW (ref >60)
GFR, EST AFRICAN AMERICAN: 31 mL/min — AB (ref >60)
Glucose, Bld: 119 mg/dL — ABNORMAL HIGH (ref 65–99)
POTASSIUM: 3.7 mmol/L (ref 3.5–5.1)
SODIUM: 139 mmol/L (ref 135–145)

## 2014-12-14 MED ORDER — CLINDAMYCIN HCL 150 MG PO CAPS: 300.0000 mg | ORAL_CAPSULE | Freq: Three times a day (TID) | ORAL | Status: AC

## 2014-12-14 NOTE — ED Notes (Signed)
Pt discharged to home

## 2014-12-14 NOTE — Discharge Instructions (Signed)
You were seen today and likely have an infection of your right leg. You'll be placed on antibiotics. If he develops fever, worsening redness or swelling should be reevaluated immediately. If you develop diarrhea, this could be related to the antibiotics and you need to be reevaluated as well.  Cellulitis Cellulitis is an infection of the skin and the tissue beneath it. The infected area is usually red and tender. Cellulitis occurs most often in the arms and lower legs.  CAUSES  Cellulitis is caused by bacteria that enter the skin through cracks or cuts in the skin. The most common types of bacteria that cause cellulitis are staphylococci and streptococci. SIGNS AND SYMPTOMS   Redness and warmth.  Swelling.  Tenderness or pain.  Fever. DIAGNOSIS  Your health care provider can usually determine what is wrong based on a physical exam. Blood tests may also be done. TREATMENT  Treatment usually involves taking an antibiotic medicine. HOME CARE INSTRUCTIONS   Take your antibiotic medicine as directed by your health care provider. Finish the antibiotic even if you start to feel better.  Keep the infected arm or leg elevated to reduce swelling.  Apply a warm cloth to the affected area up to 4 times per day to relieve pain.  Take medicines only as directed by your health care provider.  Keep all follow-up visits as directed by your health care provider. SEEK MEDICAL CARE IF:   You notice red streaks coming from the infected area.  Your red area gets larger or turns dark in color.  Your bone or joint underneath the infected area becomes painful after the skin has healed.  Your infection returns in the same area or another area.  You notice a swollen bump in the infected area.  You develop new symptoms.  You have a fever. SEEK IMMEDIATE MEDICAL CARE IF:   You feel very sleepy.  You develop vomiting or diarrhea.  You have a general ill feeling (malaise) with muscle aches and  pains. MAKE SURE YOU:   Understand these instructions.  Will watch your condition.  Will get help right away if you are not doing well or get worse. Document Released: 12/09/2004 Document Revised: 07/16/2013 Document Reviewed: 05/17/2011 [facility] Patient Information 2015 [place], [place]. This information is not intended to replace advice given to you by your health care provider. Make sure you discuss any questions you have with your health care provider.

## 2015-04-30 DIAGNOSIS — J441 Chronic obstructive pulmonary disease with (acute) exacerbation: Secondary | ICD-10-CM | POA: Diagnosis not present

## 2015-04-30 DIAGNOSIS — R0902 Hypoxemia: Secondary | ICD-10-CM | POA: Diagnosis not present

## 2015-05-03 DIAGNOSIS — J449 Chronic obstructive pulmonary disease, unspecified: Secondary | ICD-10-CM | POA: Diagnosis not present

## 2015-05-04 DIAGNOSIS — J449 Chronic obstructive pulmonary disease, unspecified: Secondary | ICD-10-CM | POA: Diagnosis not present

## 2015-05-09 DIAGNOSIS — I5032 Chronic diastolic (congestive) heart failure: Secondary | ICD-10-CM | POA: Diagnosis not present

## 2015-05-09 DIAGNOSIS — M1991 Primary osteoarthritis, unspecified site: Secondary | ICD-10-CM | POA: Diagnosis not present

## 2015-05-09 DIAGNOSIS — J449 Chronic obstructive pulmonary disease, unspecified: Secondary | ICD-10-CM | POA: Diagnosis not present

## 2015-05-20 DIAGNOSIS — J449 Chronic obstructive pulmonary disease, unspecified: Secondary | ICD-10-CM | POA: Diagnosis not present

## 2015-05-20 DIAGNOSIS — L89899 Pressure ulcer of other site, unspecified stage: Secondary | ICD-10-CM | POA: Diagnosis not present

## 2015-05-22 DIAGNOSIS — J449 Chronic obstructive pulmonary disease, unspecified: Secondary | ICD-10-CM | POA: Diagnosis not present

## 2015-05-27 DIAGNOSIS — J441 Chronic obstructive pulmonary disease with (acute) exacerbation: Secondary | ICD-10-CM | POA: Diagnosis not present

## 2015-06-06 DIAGNOSIS — M1991 Primary osteoarthritis, unspecified site: Secondary | ICD-10-CM | POA: Diagnosis not present

## 2015-06-06 DIAGNOSIS — J449 Chronic obstructive pulmonary disease, unspecified: Secondary | ICD-10-CM | POA: Diagnosis not present

## 2015-06-06 DIAGNOSIS — I5032 Chronic diastolic (congestive) heart failure: Secondary | ICD-10-CM | POA: Diagnosis not present

## 2015-07-07 DIAGNOSIS — M1991 Primary osteoarthritis, unspecified site: Secondary | ICD-10-CM | POA: Diagnosis not present

## 2015-07-07 DIAGNOSIS — J449 Chronic obstructive pulmonary disease, unspecified: Secondary | ICD-10-CM | POA: Diagnosis not present

## 2015-07-07 DIAGNOSIS — I5032 Chronic diastolic (congestive) heart failure: Secondary | ICD-10-CM | POA: Diagnosis not present

## 2015-07-23 DIAGNOSIS — J449 Chronic obstructive pulmonary disease, unspecified: Secondary | ICD-10-CM | POA: Diagnosis not present

## 2015-07-23 DIAGNOSIS — I10 Essential (primary) hypertension: Secondary | ICD-10-CM | POA: Diagnosis not present

## 2015-07-23 DIAGNOSIS — D509 Iron deficiency anemia, unspecified: Secondary | ICD-10-CM | POA: Diagnosis not present

## 2015-07-23 DIAGNOSIS — J9611 Chronic respiratory failure with hypoxia: Secondary | ICD-10-CM | POA: Diagnosis not present

## 2015-07-23 DIAGNOSIS — N183 Chronic kidney disease, stage 3 (moderate): Secondary | ICD-10-CM | POA: Diagnosis not present

## 2015-08-06 DIAGNOSIS — M1991 Primary osteoarthritis, unspecified site: Secondary | ICD-10-CM | POA: Diagnosis not present

## 2015-08-06 DIAGNOSIS — I5032 Chronic diastolic (congestive) heart failure: Secondary | ICD-10-CM | POA: Diagnosis not present

## 2015-08-06 DIAGNOSIS — J449 Chronic obstructive pulmonary disease, unspecified: Secondary | ICD-10-CM | POA: Diagnosis not present

## 2015-08-21 DIAGNOSIS — J449 Chronic obstructive pulmonary disease, unspecified: Secondary | ICD-10-CM | POA: Diagnosis not present

## 2015-09-05 DIAGNOSIS — R0902 Hypoxemia: Secondary | ICD-10-CM | POA: Diagnosis not present

## 2015-09-05 DIAGNOSIS — J449 Chronic obstructive pulmonary disease, unspecified: Secondary | ICD-10-CM | POA: Diagnosis not present

## 2015-09-05 DIAGNOSIS — R600 Localized edema: Secondary | ICD-10-CM | POA: Diagnosis not present

## 2015-09-20 DIAGNOSIS — J449 Chronic obstructive pulmonary disease, unspecified: Secondary | ICD-10-CM | POA: Diagnosis not present

## 2015-10-21 DIAGNOSIS — J449 Chronic obstructive pulmonary disease, unspecified: Secondary | ICD-10-CM | POA: Diagnosis not present

## 2015-10-25 DIAGNOSIS — L03119 Cellulitis of unspecified part of limb: Secondary | ICD-10-CM | POA: Diagnosis not present

## 2015-10-25 DIAGNOSIS — R109 Unspecified abdominal pain: Secondary | ICD-10-CM | POA: Diagnosis not present

## 2015-11-04 DIAGNOSIS — D509 Iron deficiency anemia, unspecified: Secondary | ICD-10-CM | POA: Diagnosis not present

## 2015-11-04 DIAGNOSIS — I10 Essential (primary) hypertension: Secondary | ICD-10-CM | POA: Diagnosis not present

## 2015-11-04 DIAGNOSIS — R7301 Impaired fasting glucose: Secondary | ICD-10-CM | POA: Diagnosis not present

## 2015-11-04 DIAGNOSIS — E669 Obesity, unspecified: Secondary | ICD-10-CM | POA: Diagnosis not present

## 2015-11-04 DIAGNOSIS — Z125 Encounter for screening for malignant neoplasm of prostate: Secondary | ICD-10-CM | POA: Diagnosis not present

## 2015-11-04 DIAGNOSIS — E785 Hyperlipidemia, unspecified: Secondary | ICD-10-CM | POA: Diagnosis not present

## 2015-11-06 DIAGNOSIS — E782 Mixed hyperlipidemia: Secondary | ICD-10-CM | POA: Diagnosis not present

## 2015-11-06 DIAGNOSIS — J449 Chronic obstructive pulmonary disease, unspecified: Secondary | ICD-10-CM | POA: Diagnosis not present

## 2015-11-06 DIAGNOSIS — M1991 Primary osteoarthritis, unspecified site: Secondary | ICD-10-CM | POA: Diagnosis not present

## 2015-11-06 DIAGNOSIS — D509 Iron deficiency anemia, unspecified: Secondary | ICD-10-CM | POA: Diagnosis not present

## 2015-11-06 DIAGNOSIS — N183 Chronic kidney disease, stage 3 (moderate): Secondary | ICD-10-CM | POA: Diagnosis not present

## 2015-11-21 DIAGNOSIS — J449 Chronic obstructive pulmonary disease, unspecified: Secondary | ICD-10-CM | POA: Diagnosis not present

## 2015-12-16 DIAGNOSIS — L03115 Cellulitis of right lower limb: Secondary | ICD-10-CM | POA: Diagnosis not present

## 2015-12-16 DIAGNOSIS — L03116 Cellulitis of left lower limb: Secondary | ICD-10-CM | POA: Diagnosis not present

## 2015-12-21 DIAGNOSIS — J449 Chronic obstructive pulmonary disease, unspecified: Secondary | ICD-10-CM | POA: Diagnosis not present

## 2016-01-06 DIAGNOSIS — S91209A Unspecified open wound of unspecified toe(s) with damage to nail, initial encounter: Secondary | ICD-10-CM | POA: Diagnosis not present

## 2016-01-06 DIAGNOSIS — L989 Disorder of the skin and subcutaneous tissue, unspecified: Secondary | ICD-10-CM | POA: Diagnosis not present

## 2016-01-21 DIAGNOSIS — J449 Chronic obstructive pulmonary disease, unspecified: Secondary | ICD-10-CM | POA: Diagnosis not present

## 2016-02-20 DIAGNOSIS — J449 Chronic obstructive pulmonary disease, unspecified: Secondary | ICD-10-CM | POA: Diagnosis not present

## 2016-02-21 DIAGNOSIS — R404 Transient alteration of awareness: Secondary | ICD-10-CM | POA: Diagnosis not present

## 2016-03-15 DIAGNOSIS — 419620001 Death: Secondary | SNOMED CT | POA: Diagnosis not present

## 2016-03-15 DEATH — deceased

## 2017-04-30 IMAGING — DX DG FOOT COMPLETE 3+V*R*
3 series · 3 of 3 positions shown · non-contrast
Comparison: None.

CLINICAL DATA: Foot pain, redness and swelling. Wound to lateral
leg. Dropped a can on foot a few days ago. Spilled Chemicals on foot
a few days ago.

EXAM:
RIGHT FOOT COMPLETE - 3+ VIEW

[foot ap]
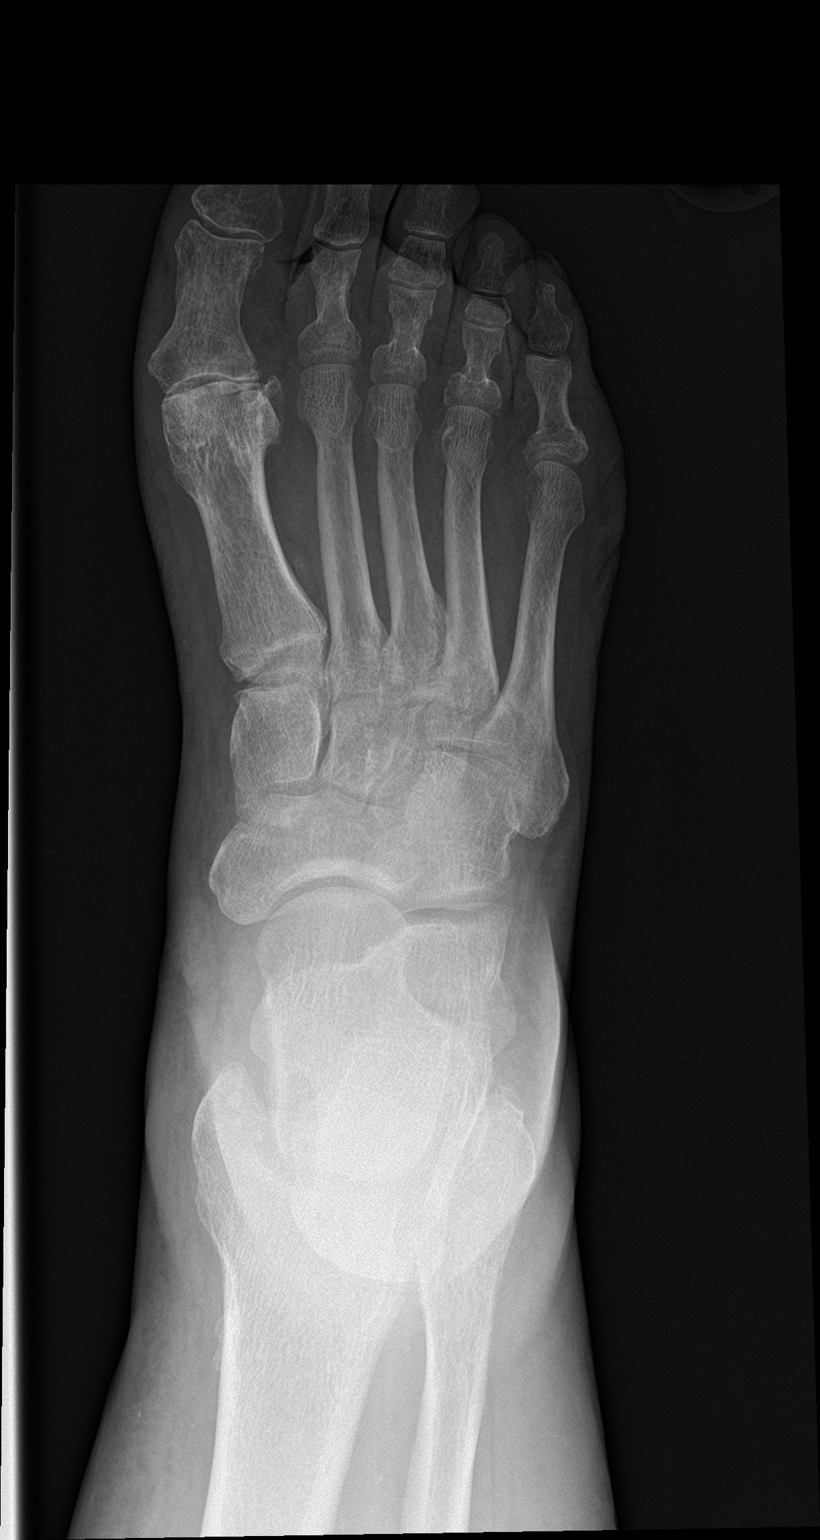

[foot obl]
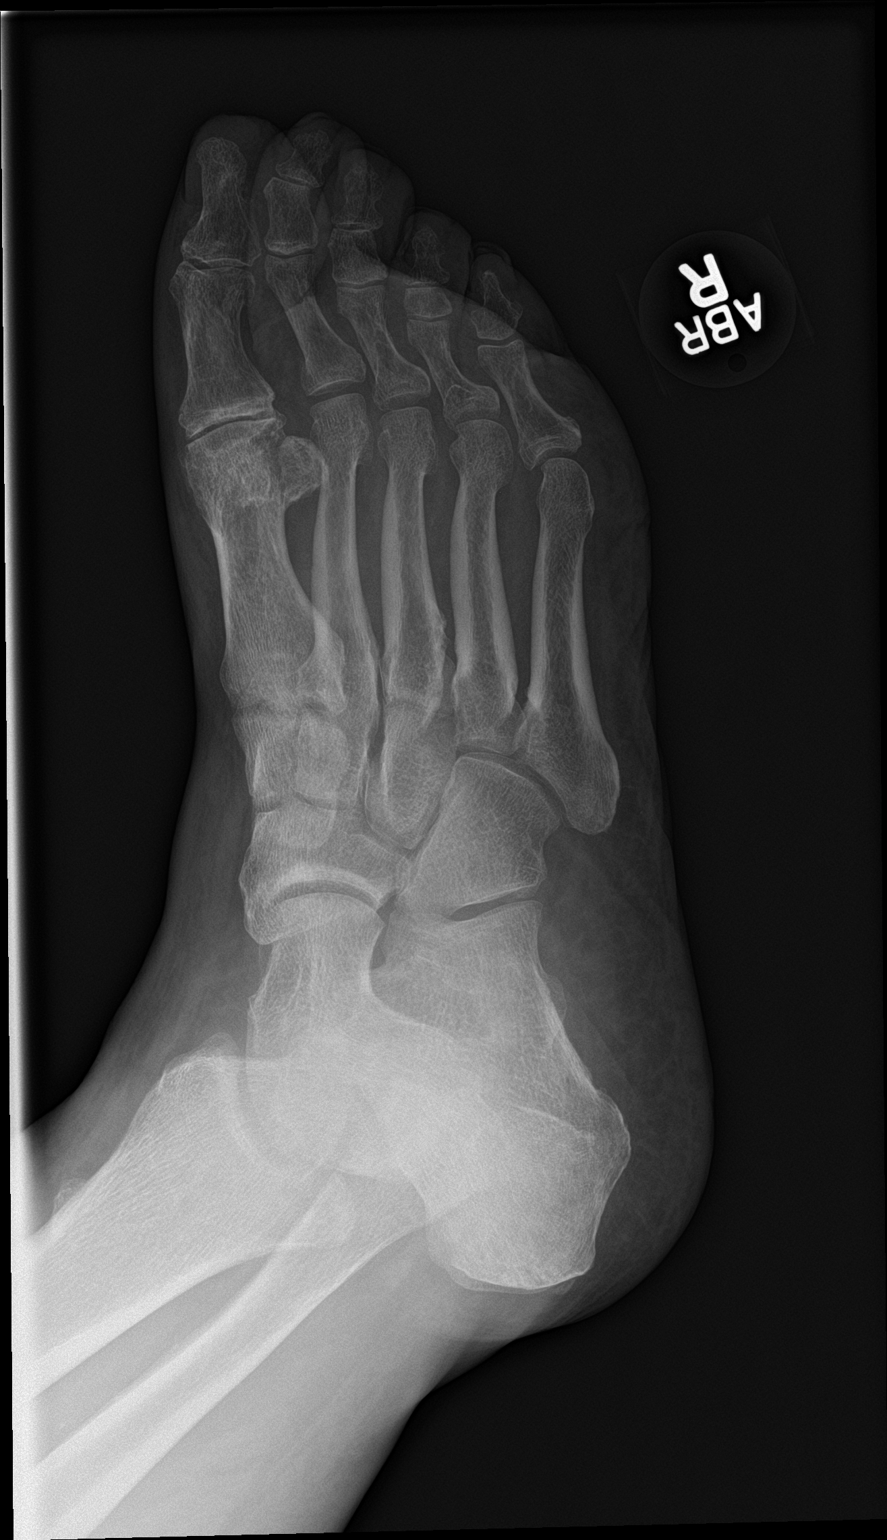

[foot lat]
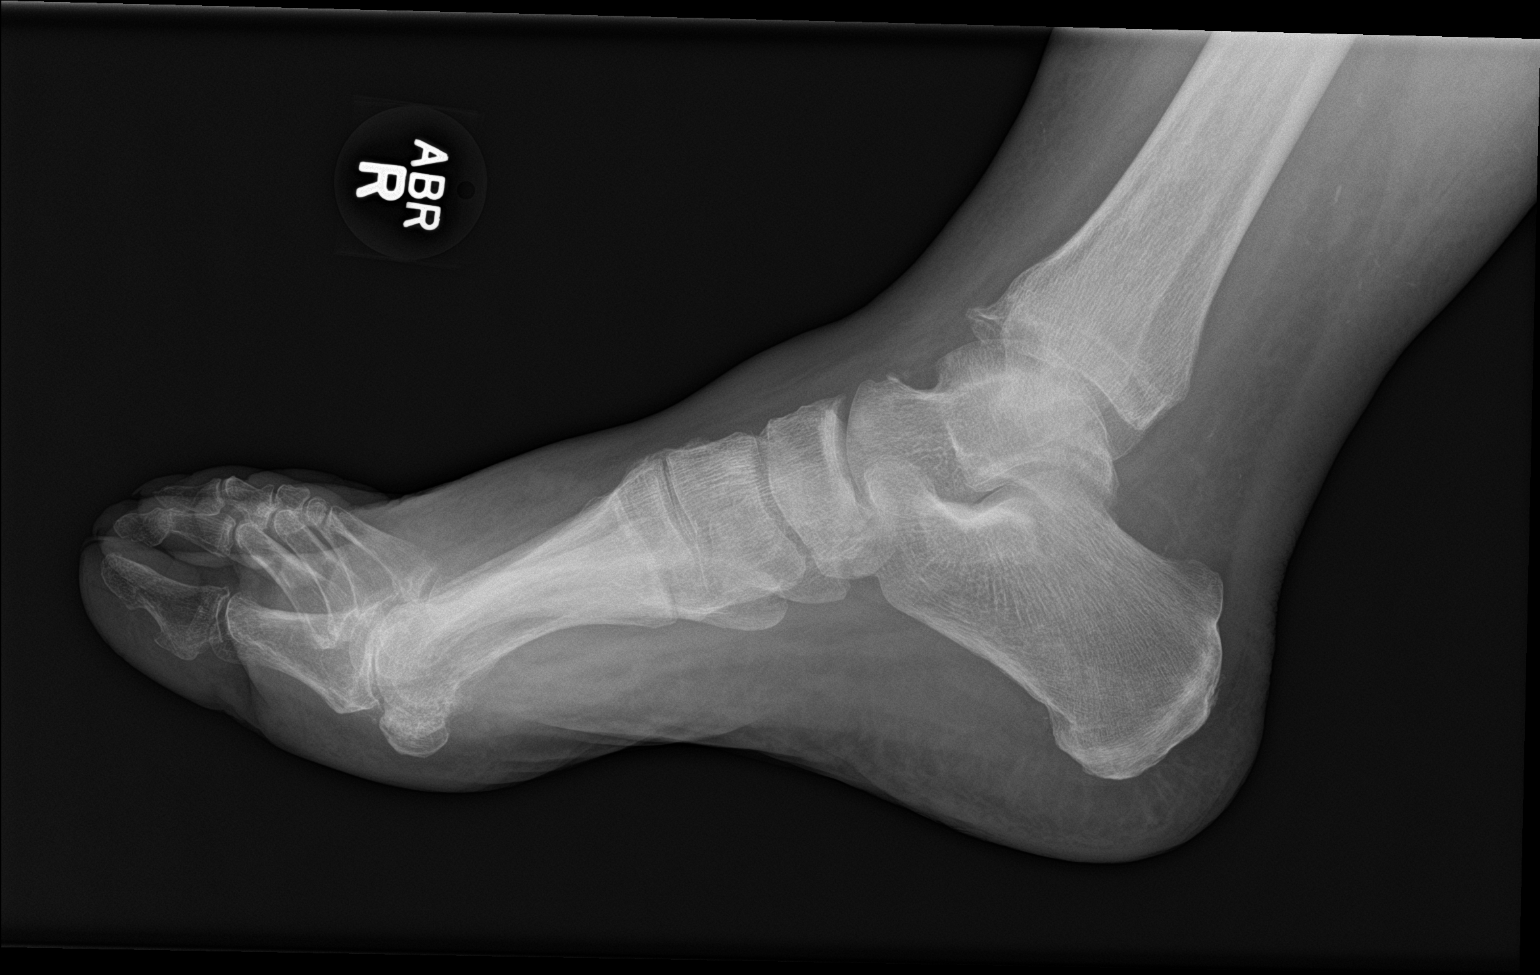

[3 of 3 positions shown; findings below may reference images not displayed]

FINDINGS: No acute fracture deformity. No dislocation. Moderate to severe
first metatarsal phalangeal joint space narrowing with marginal
spurring consistent with osteoarthrosis. Osteopenia without
destructive bony lesions. Mild midfoot osteoarthrosis. Mild vascular
calcifications, soft tissue planes are nonsuspicious.
IMPRESSION: No acute fracture deformity or dislocation.

Moderate to severe first MTP osteoarthrosis.
